# Patient Record
Sex: Female | Born: 1995 | Race: White | Hispanic: No | Marital: Single | Smoking: Current every day smoker
Health system: Southern US, Community
[De-identification: ages and names within clinical notes are randomized; demographics above are authoritative.]

## PROBLEM LIST (undated history)

## (undated) DIAGNOSIS — N200 Calculus of kidney: Secondary | ICD-10-CM

## (undated) DIAGNOSIS — N898 Other specified noninflammatory disorders of vagina: Secondary | ICD-10-CM

## (undated) DIAGNOSIS — A749 Chlamydial infection, unspecified: Secondary | ICD-10-CM

## (undated) HISTORY — DX: Chlamydial infection, unspecified: A74.9

## (undated) HISTORY — DX: Other specified noninflammatory disorders of vagina: N89.8

## (undated) HISTORY — DX: Calculus of kidney: N20.0

---
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
## 2013-07-06 ENCOUNTER — Other Ambulatory Visit: Payer: Self-pay | Admitting: Women's Health

## 2013-11-23 ENCOUNTER — Encounter: Payer: Self-pay | Admitting: Advanced Practice Midwife

## 2013-11-23 ENCOUNTER — Ambulatory Visit (INDEPENDENT_AMBULATORY_CARE_PROVIDER_SITE_OTHER): Payer: Medicaid Other | Admitting: Advanced Practice Midwife

## 2013-11-23 VITALS — BP 108/80 | Ht 60.0 in | Wt 176.0 lb

## 2013-11-23 DIAGNOSIS — Z113 Encounter for screening for infections with a predominantly sexual mode of transmission: Secondary | ICD-10-CM

## 2013-11-23 DIAGNOSIS — Z01419 Encounter for gynecological examination (general) (routine) without abnormal findings: Secondary | ICD-10-CM

## 2013-11-23 DIAGNOSIS — Z0000 Encounter for general adult medical examination without abnormal findings: Secondary | ICD-10-CM

## 2013-11-23 MED ORDER — NORETHIN-ETH ESTRAD-FE BIPHAS 1 MG-10 MCG / 10 MCG PO TABS: 1.0000 | ORAL_TABLET | Freq: Every day | ORAL | Status: DC

## 2013-11-23 NOTE — Progress Notes (Signed)
Valerie Greene 18 y.o.  Filed Vitals:   11/23/13 1116  BP: 108/80     Past Medical History: History reviewed. No pertinent past medical history.  Past Surgical History: History reviewed. No pertinent past surgical history.  Family History: No family history on file.  Social History: History  Substance Use Topics  . Smoking status: Current Every Day Smoker  . Smokeless tobacco: Not on file  . Alcohol Use: Yes    Allergies: No Known Allergies   No current outpatient prescriptions on file.  History of Present Illness:  Here for pap and physical.  Informed that pap is not indicated until age 69. Requests STD screening. Has already had HPV vaccine.  Is not on any birth control.  Options discussed.  Will try pills (had used Nexplanon in the past, bled through it).    Review of Systems   Patient denies any headaches, blurred vision, shortness of breath, chest pain, abdominal pain, problems with bowel movements, urination, or intercourse.   Physical Exam: General:  Well developed, well nourished, no acute distress Skin:  Warm and dry Neck:  Midline trachea, normal thyroid Lungs; Clear to auscultation bilaterally Breast:  Deferred  Cardiovascular: Regular rate and rhythm Abdomen:  Soft, non tender, no hepatosplenomegaly Extremities:  No swelling or varicosities noted Psych:  No mood changes.     Impression: normal PE; STD screening  Plan: GC/CHL, HIV, RPR, HSV2, Hep C

## 2013-11-24 LAB — GC/CHLAMYDIA PROBE AMP
CT PROBE, AMP APTIMA: NEGATIVE
GC Probe RNA: NEGATIVE

## 2013-11-24 LAB — HEPATITIS C ANTIBODY: HCV Ab: NEGATIVE

## 2013-11-24 LAB — HIV ANTIBODY (ROUTINE TESTING W REFLEX): HIV: NONREACTIVE

## 2013-11-24 LAB — HSV 2 ANTIBODY, IGG

## 2013-11-24 LAB — RPR

## 2013-11-26 ENCOUNTER — Emergency Department (HOSPITAL_COMMUNITY)
Admission: EM | Admit: 2013-11-26 | Discharge: 2013-11-26 | Disposition: A | Discharge status: Home / Self Care | Payer: Medicaid Other | Attending: Emergency Medicine | Admitting: Emergency Medicine

## 2013-11-26 ENCOUNTER — Encounter (HOSPITAL_COMMUNITY): Payer: Self-pay | Admitting: Emergency Medicine

## 2013-11-26 DIAGNOSIS — W268XXA Contact with other sharp object(s), not elsewhere classified, initial encounter: Secondary | ICD-10-CM | POA: Diagnosis not present

## 2013-11-26 DIAGNOSIS — Y929 Unspecified place or not applicable: Secondary | ICD-10-CM | POA: Insufficient documentation

## 2013-11-26 DIAGNOSIS — Z79899 Other long term (current) drug therapy: Secondary | ICD-10-CM | POA: Insufficient documentation

## 2013-11-26 DIAGNOSIS — S61209A Unspecified open wound of unspecified finger without damage to nail, initial encounter: Secondary | ICD-10-CM | POA: Insufficient documentation

## 2013-11-26 DIAGNOSIS — Y939 Activity, unspecified: Secondary | ICD-10-CM | POA: Insufficient documentation

## 2013-11-26 DIAGNOSIS — F17200 Nicotine dependence, unspecified, uncomplicated: Secondary | ICD-10-CM | POA: Insufficient documentation

## 2013-11-26 DIAGNOSIS — S0180XA Unspecified open wound of other part of head, initial encounter: Secondary | ICD-10-CM | POA: Diagnosis not present

## 2013-11-26 DIAGNOSIS — Z23 Encounter for immunization: Secondary | ICD-10-CM | POA: Insufficient documentation

## 2013-11-26 DIAGNOSIS — S61211A Laceration without foreign body of left index finger without damage to nail, initial encounter: Secondary | ICD-10-CM

## 2013-11-26 DIAGNOSIS — S0181XA Laceration without foreign body of other part of head, initial encounter: Secondary | ICD-10-CM

## 2013-11-26 MED ORDER — LIDOCAINE-EPINEPHRINE (PF) 1 %-1:200000 IJ SOLN
10.0000 mL | Freq: Once | INTRAMUSCULAR | Status: AC
  Administered 2013-11-26: 10 mL
  Filled 2013-11-26: qty 10

## 2013-11-26 MED ORDER — LIDOCAINE-EPINEPHRINE (PF) 1 %-1:200000 IJ SOLN
INTRAMUSCULAR | Status: AC
  Filled 2013-11-26: qty 10

## 2013-11-26 MED ORDER — BACITRACIN ZINC 500 UNIT/GM EX OINT
TOPICAL_OINTMENT | CUTANEOUS | Status: AC
  Administered 2013-11-26: 2 via TOPICAL
  Filled 2013-11-26: qty 0.9

## 2013-11-26 MED ORDER — TETANUS-DIPHTH-ACELL PERTUSSIS 5-2.5-18.5 LF-MCG/0.5 IM SUSP
0.5000 mL | Freq: Once | INTRAMUSCULAR | Status: AC
  Administered 2013-11-26: 0.5 mL via INTRAMUSCULAR
  Filled 2013-11-26: qty 0.5
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
## 2013-11-26 NOTE — Discharge Instructions (Signed)
Stitches in your face need to be removed in 5 days. Stitches in your finger need to be removed in 7 days.  Laceration Care, Adult A laceration is a cut or lesion that goes through all layers of the skin and into the tissue just beneath the skin. TREATMENT  Some lacerations may not require closure. Some lacerations may not be able to be closed due to an increased risk of infection. It is important to see your caregiver as soon as possible after an injury to minimize the risk of infection and maximize the opportunity for successful closure. If closure is appropriate, pain medicines may be given, if needed. The wound will be cleaned to help prevent infection. Your caregiver will use stitches (sutures), staples, wound glue (adhesive), or skin adhesive strips to repair the laceration. These tools bring the skin edges together to allow for faster healing and a better cosmetic outcome. However, all wounds will heal with a scar. Once the wound has healed, scarring can be minimized by covering the wound with sunscreen during the day for 1 full year. HOME CARE INSTRUCTIONS  For sutures or staples:  Keep the wound clean and dry.  If you were given a bandage (dressing), you should change it at least once a day. Also, change the dressing if it becomes wet or dirty, or as directed by your caregiver.  Wash the wound with soap and water 2 times a day. Rinse the wound off with water to remove all soap. Pat the wound dry with a clean towel.  After cleaning, apply a thin layer of the antibiotic ointment as recommended by your caregiver. This will help prevent infection and keep the dressing from sticking.  You may shower as usual after the first 24 hours. Do not soak the wound in water until the sutures are removed.  Only take over-the-counter or prescription medicines for pain, discomfort, or fever as directed by your caregiver.  Get your sutures or staples removed as directed by your caregiver. For skin  adhesive strips:  Keep the wound clean and dry.  Do not get the skin adhesive strips wet. You may bathe carefully, using caution to keep the wound dry.  If the wound gets wet, pat it dry with a clean towel.  Skin adhesive strips will fall off on their own. You may trim the strips as the wound heals. Do not remove skin adhesive strips that are still stuck to the wound. They will fall off in time. For wound adhesive:  You may briefly wet your wound in the shower or bath. Do not soak or scrub the wound. Do not swim. Avoid periods of heavy perspiration until the skin adhesive has fallen off on its own. After showering or bathing, gently pat the wound dry with a clean towel.  Do not apply liquid medicine, cream medicine, or ointment medicine to your wound while the skin adhesive is in place. This may loosen the film before your wound is healed.  If a dressing is placed over the wound, be careful not to apply tape directly over the skin adhesive. This may cause the adhesive to be pulled off before the wound is healed.  Avoid prolonged exposure to sunlight or tanning lamps while the skin adhesive is in place. Exposure to ultraviolet light in the first year will darken the scar.  The skin adhesive will usually remain in place for 5 to 10 days, then naturally fall off the skin. Do not pick at the adhesive film. You may  need a tetanus shot if:  You cannot remember when you had your last tetanus shot.  You have never had a tetanus shot. If you get a tetanus shot, your arm may swell, get red, and feel warm to the touch. This is common and not a problem. If you need a tetanus shot and you choose not to have one, there is a rare chance of getting tetanus. Sickness from tetanus can be serious. SEEK MEDICAL CARE IF:   You have redness, swelling, or increasing pain in the wound.  You see a red line that goes away from the wound.  You have yellowish-white fluid (pus) coming from the wound.  You have  a fever.  You notice a bad smell coming from the wound or dressing.  Your wound breaks open before or after sutures have been removed.  You notice something coming out of the wound such as wood or glass.  Your wound is on your hand or foot and you cannot move a finger or toe. SEEK IMMEDIATE MEDICAL CARE IF:   Your pain is not controlled with prescribed medicine.  You have severe swelling around the wound causing pain and numbness or a change in color in your arm, hand, leg, or foot.  Your wound splits open and starts bleeding.  You have worsening numbness, weakness, or loss of function of any joint around or beyond the wound.  You develop painful lumps near the wound or on the skin anywhere on your body. MAKE SURE YOU:   Understand these instructions.  Will watch your condition.  Will get help right away if you are not doing well or get worse. Document Released: 03/17/2005 Document Revised: 06/09/2011 Document Reviewed: 09/10/2010 [facility] Patient Information 2015 [place], [place]. This information is not intended to replace advice given to you by your health care provider. Make sure you discuss any questions you have with your health care provider.  Tdap Vaccine (Tetanus, Diphtheria, Pertussis): What You Need to Know 1. Why get vaccinated? Tetanus, diphtheria and pertussis can be very serious diseases, even for adolescents and adults. Tdap vaccine can protect [place] from these diseases. TETANUS (Lockjaw) causes painful muscle tightening and stiffness, usually all over the body.  It can lead to tightening of muscles in the head and neck so you can't open your mouth, swallow, or sometimes even breathe. Tetanus kills about 1 out of 5 people who are infected. DIPHTHERIA can cause a thick coating to form in the back of the throat.  It can lead to breathing problems, paralysis, heart failure, and death. PERTUSSIS (Whooping Cough) causes severe coughing spells, which can cause difficulty  breathing, vomiting and disturbed sleep.  It can also lead to weight loss, incontinence, and rib fractures. Up to 2 in 100 adolescents and 5 in 100 adults with pertussis are hospitalized or have complications, which could include pneumonia or death. These diseases are caused by bacteria. Diphtheria and pertussis are spread from person to person through coughing or sneezing. Tetanus enters the body through cuts, scratches, or wounds. Before vaccines, the [place] saw as many as 200,000 cases a year of diphtheria and pertussis, and hundreds of cases of tetanus. Since vaccination began, tetanus and diphtheria have dropped by about 99% and pertussis by about 80%. 2. Tdap vaccine Tdap vaccine can protect adolescents and adults from tetanus, diphtheria, and pertussis. One dose of Tdap is routinely given at age 61 or 43. People who did not get Tdap at that age should get it as soon  as possible. Tdap is especially important for health care professionals and anyone having close contact with a baby younger than 12 months. Pregnant women should get a dose of Tdap during every pregnancy, to protect the newborn from pertussis. Infants are most at risk for severe, life-threatening complications from pertussis. A similar vaccine, called Td, protects from tetanus and diphtheria, but not pertussis. A Td booster should be given every 10 years. Tdap may be given as one of these boosters if you have not already gotten a dose. Tdap may also be given after a severe cut or burn to prevent tetanus infection. Your doctor can give you more information. Tdap may safely be given at the same time as other vaccines. 3. Some people should not get this vaccine  If you ever had a life-threatening allergic reaction after a dose of any tetanus, diphtheria, or pertussis containing vaccine, OR if you have a severe allergy to any part of this vaccine, you should not get Tdap. Tell your doctor if you have any severe allergies.  If  you had a coma, or long or multiple seizures within 7 days after a childhood dose of DTP or DTaP, you should not get Tdap, unless a cause other than the vaccine was found. You can still get Td.  Talk to your doctor if you:  have epilepsy or another nervous system problem,  had severe pain or swelling after any vaccine containing diphtheria, tetanus or pertussis,  ever had Guillain-Barr Syndrome (GBS),  aren't feeling well on the day the shot is scheduled. 4. Risks of a vaccine reaction With any medicine, including vaccines, there is a chance of side effects. These are usually mild and go away on their own, but serious reactions are also possible. Brief fainting spells can follow a vaccination, leading to injuries from falling. Sitting or lying down for about 15 minutes can help prevent these. Tell your doctor if you feel dizzy or light-headed, or have vision changes or ringing in the ears. Mild problems following Tdap (Did not interfere with activities)  Pain where the shot was given (about 3 in 4 adolescents or 2 in 3 adults)  Redness or swelling where the shot was given (about 1 person in 5)  Mild fever of at least 100.70F (up to about 1 in 25 adolescents or 1 in 100 adults)  Headache (about 3 or 4 people in 10)  Tiredness (about 1 person in 3 or 4)  Nausea, vomiting, diarrhea, stomach ache (up to 1 in 4 adolescents or 1 in 10 adults)  Chills, body aches, sore joints, rash, swollen glands (uncommon) Moderate problems following Tdap (Interfered with activities, but did not require medical attention)  Pain where the shot was given (about 1 in 5 adolescents or 1 in 100 adults)  Redness or swelling where the shot was given (up to about 1 in 16 adolescents or 1 in 25 adults)  Fever over 102F (about 1 in 100 adolescents or 1 in 250 adults)  Headache (about 3 in 20 adolescents or 1 in 10 adults)  Nausea, vomiting, diarrhea, stomach ache (up to 1 or 3 people in 100)  Swelling  of the entire arm where the shot was given (up to about 3 in 100). Severe problems following Tdap (Unable to perform usual activities; required medical attention)  Swelling, severe pain, bleeding and redness in the arm where the shot was given (rare). A severe allergic reaction could occur after any vaccine (estimated less than 1 in a million doses).  5. What if there is a serious reaction? What should I look for?  Look for anything that concerns you, such as signs of a severe allergic reaction, very high fever, or behavior changes. Signs of a severe allergic reaction can include hives, swelling of the face and throat, difficulty breathing, a fast heartbeat, dizziness, and weakness. These would start a few minutes to a few hours after the vaccination. What should I do?  If you think it is a severe allergic reaction or other emergency that can't wait, call 9-1-1 or get the person to the nearest hospital. Otherwise, call your doctor.  Afterward, the reaction should be reported to the "Vaccine Adverse Event Reporting System" (VAERS). Your doctor might file this report, or you can do it yourself through the VAERS web site at www.vaers.LAgents.no, or by calling 1-(872)170-1846. VAERS is only for reporting reactions. They do not give medical advice.  6. The National Vaccine Injury Compensation Program The [employer] Vaccine Injury Compensation Program (VICP) is a federal program that was created to compensate people who may have been injured by certain vaccines. Persons who believe they may have been injured by a vaccine can learn about the program and about filing a claim by calling 1-919-237-5053 or visiting the VICP website at SpiritualWord.at. 7. How can I learn more?  Ask your doctor.  Call your local or state health department.  Contact the Centers for Disease Control and Prevention (CDC):  Call 782-475-6645 or visit CDC's website at PicCapture.uy. CDC Tdap Vaccine VIS  (08/07/11) Document Released: 09/16/2011 Document Revised: 08/01/2013 Document Reviewed: 06/29/2013 [employer] Patient Information 2015 [place], [place]. This information is not intended to replace advice given to you by your health care provider. Make sure you discuss any questions you have with your health care provider.
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
## 2013-11-26 NOTE — ED Notes (Signed)
Much calmer, cooperative. sutured

## 2013-11-28 LAB — TRICHOMONAS VAGINALIS, PROBE AMP: TRICHOMONAS VAGINALIS PROBE APTIMA: NEGATIVE

## 2013-11-30 ENCOUNTER — Telehealth: Payer: Self-pay | Admitting: Advanced Practice Midwife

## 2013-11-30 NOTE — Telephone Encounter (Signed)
Pt informed of negative lab results.  

## 2013-12-01 ENCOUNTER — Emergency Department (HOSPITAL_COMMUNITY)
Admission: EM | Admit: 2013-12-01 | Discharge: 2013-12-01 | Discharge status: Against Medical Advice | Payer: Medicaid Other | Attending: Emergency Medicine | Admitting: Emergency Medicine

## 2013-12-01 ENCOUNTER — Encounter (HOSPITAL_COMMUNITY): Payer: Self-pay | Admitting: Emergency Medicine

## 2013-12-01 DIAGNOSIS — Z4802 Encounter for removal of sutures: Secondary | ICD-10-CM | POA: Diagnosis present

## 2013-12-01 NOTE — ED Notes (Signed)
Pt no longer in room on multiple occasions, pt left without informing staff of leaving

## 2013-12-01 NOTE — ED Notes (Signed)
PT had sutures x5 days to forehead and left hand index finger. PT was told to come to ED for suture removal.

## 2013-12-21 ENCOUNTER — Encounter: Payer: Self-pay | Admitting: Adult Health

## 2013-12-21 ENCOUNTER — Ambulatory Visit (INDEPENDENT_AMBULATORY_CARE_PROVIDER_SITE_OTHER): Payer: Medicaid Other | Admitting: Adult Health

## 2013-12-21 VITALS — BP 100/60 | Ht 60.0 in | Wt 177.0 lb

## 2013-12-21 DIAGNOSIS — N898 Other specified noninflammatory disorders of vagina: Secondary | ICD-10-CM

## 2013-12-21 HISTORY — DX: Other specified noninflammatory disorders of vagina: N89.8

## 2013-12-21 LAB — POCT WET PREP (WET MOUNT)
TRICHOMONAS WET PREP HPF POC: NEGATIVE
WBC, Wet Prep HPF POC: NEGATIVE

## 2013-12-21 NOTE — Progress Notes (Signed)
Subjective:     Patient ID: Valerie Greene, female   DOB: November 20, 1995, 17 y.o.   MRN: [id number]  HPI Valerie Greene is a 18 year old white female in complaining of discharge x 4-5 months, had negative STD testing in August.   Review of Systems See HPI Reviewed past medical,surgical, social and family history. Reviewed medications and allergies.     Objective:   Physical Exam BP 100/60  Ht 5' (1.524 m)  Wt 177 lb (80.287 kg)  BMI 34.57 kg/m2  LMP 12/15/2013   Skin warm and dry.Pelvic: external genitalia is normal in appearance, vagina:creamy discharge without odor, cervix:smooth and bulbous, uterus: normal size, shape and contour, non tender, no masses felt, adnexa: no masses or tenderness noted. Wet prep: negative, discussed normal discharge,pills should decrease discharge.  Assessment:     Vaginal discharge    Plan:     Continue OCs  Follow up prn

## 2013-12-21 NOTE — Patient Instructions (Signed)
Continue OCs  Discharge was normal

## 2014-01-30 ENCOUNTER — Encounter: Payer: Self-pay | Admitting: Adult Health

## 2014-02-22 ENCOUNTER — Ambulatory Visit: Payer: Medicaid Other | Admitting: Advanced Practice Midwife

## 2014-03-01 ENCOUNTER — Ambulatory Visit: Payer: Medicaid Other | Admitting: Advanced Practice Midwife

## 2014-03-07 ENCOUNTER — Ambulatory Visit: Payer: Medicaid Other | Admitting: Advanced Practice Midwife

## 2014-03-15 ENCOUNTER — Ambulatory Visit: Payer: Medicaid Other | Admitting: Advanced Practice Midwife

## 2014-04-05 ENCOUNTER — Encounter: Payer: Self-pay | Admitting: Advanced Practice Midwife

## 2014-04-05 ENCOUNTER — Ambulatory Visit: Payer: Medicaid Other | Admitting: Advanced Practice Midwife

## 2014-09-28 ENCOUNTER — Encounter: Payer: Self-pay | Admitting: Obstetrics & Gynecology

## 2014-09-28 ENCOUNTER — Ambulatory Visit (INDEPENDENT_AMBULATORY_CARE_PROVIDER_SITE_OTHER): Payer: Medicaid Other | Admitting: Obstetrics & Gynecology

## 2014-09-28 VITALS — BP 100/70 | HR 80 | Wt 181.4 lb

## 2014-09-28 DIAGNOSIS — N938 Other specified abnormal uterine and vaginal bleeding: Secondary | ICD-10-CM

## 2014-09-28 DIAGNOSIS — N939 Abnormal uterine and vaginal bleeding, unspecified: Secondary | ICD-10-CM

## 2014-09-28 LAB — POCT HEMOGLOBIN: Hemoglobin: 13.4 g/dL (ref 12.2–16.2)

## 2014-09-28 MED ORDER — NORETHIN-ETH ESTRAD-FE BIPHAS 1 MG-10 MCG / 10 MCG PO TABS: 1.0000 | ORAL_TABLET | Freq: Every day | ORAL | Status: DC

## 2014-09-28 NOTE — Progress Notes (Signed)
Patient ID: Valerie Greene, female   DOB: 05/14/1995, 19 y.o.   MRN: [id number] Chief Complaint  Patient presents with  . gyn visit    passing blood clots last night    Pt on lo lo estrin Generally no issues with ocp Wants to continue Bleeding has stopped now  Not time for her yearly and Pap at this point Refilled her OCP     Face to face time:  10 minutes  Greater than 50% of the visit time was spent in counseling and coordination of care with the patient.  The summary and outline of the counseling and care coordination is summarized in the note above.   All questions were answered.

## 2014-10-12 ENCOUNTER — Other Ambulatory Visit: Payer: Medicaid Other | Admitting: Obstetrics & Gynecology

## 2014-11-30 ENCOUNTER — Other Ambulatory Visit: Payer: Medicaid Other | Admitting: Obstetrics & Gynecology

## 2014-11-30 ENCOUNTER — Encounter: Payer: Self-pay | Admitting: Obstetrics & Gynecology

## 2015-02-06 ENCOUNTER — Encounter: Payer: Self-pay | Admitting: Obstetrics & Gynecology

## 2015-02-06 ENCOUNTER — Ambulatory Visit: Payer: Medicaid Other | Admitting: Obstetrics & Gynecology

## 2015-03-05 ENCOUNTER — Other Ambulatory Visit: Payer: Medicaid Other | Admitting: Obstetrics & Gynecology

## 2015-03-05 ENCOUNTER — Encounter: Payer: Self-pay | Admitting: Obstetrics & Gynecology

## 2016-03-31 DIAGNOSIS — B029 Zoster without complications: Secondary | ICD-10-CM

## 2016-03-31 HISTORY — DX: Zoster without complications: B02.9

## 2016-06-04 ENCOUNTER — Ambulatory Visit (INDEPENDENT_AMBULATORY_CARE_PROVIDER_SITE_OTHER): Payer: Medicaid Other | Admitting: Adult Health

## 2016-06-04 ENCOUNTER — Other Ambulatory Visit (HOSPITAL_COMMUNITY)
Admission: RE | Admit: 2016-06-04 | Discharge: 2016-06-04 | Disposition: A | Discharge status: Home / Self Care | Payer: Medicaid Other | Source: Ambulatory Visit | Attending: Adult Health | Admitting: Adult Health

## 2016-06-04 ENCOUNTER — Encounter: Payer: Self-pay | Admitting: Adult Health

## 2016-06-04 VITALS — BP 128/88 | HR 102 | Ht 61.0 in | Wt 211.0 lb

## 2016-06-04 DIAGNOSIS — Z01411 Encounter for gynecological examination (general) (routine) with abnormal findings: Secondary | ICD-10-CM | POA: Diagnosis not present

## 2016-06-04 DIAGNOSIS — R635 Abnormal weight gain: Secondary | ICD-10-CM

## 2016-06-04 DIAGNOSIS — Z6839 Body mass index (BMI) 39.0-39.9, adult: Secondary | ICD-10-CM

## 2016-06-04 DIAGNOSIS — Z0000 Encounter for general adult medical examination without abnormal findings: Secondary | ICD-10-CM | POA: Diagnosis not present

## 2016-06-04 DIAGNOSIS — Z113 Encounter for screening for infections with a predominantly sexual mode of transmission: Secondary | ICD-10-CM | POA: Diagnosis present

## 2016-06-04 DIAGNOSIS — Z01419 Encounter for gynecological examination (general) (routine) without abnormal findings: Secondary | ICD-10-CM | POA: Diagnosis not present

## 2016-06-04 DIAGNOSIS — Z713 Dietary counseling and surveillance: Secondary | ICD-10-CM

## 2016-06-04 DIAGNOSIS — Z124 Encounter for screening for malignant neoplasm of cervix: Secondary | ICD-10-CM

## 2016-06-04 DIAGNOSIS — Z131 Encounter for screening for diabetes mellitus: Secondary | ICD-10-CM

## 2016-06-04 NOTE — Progress Notes (Signed)
Patient ID: Valerie Greene, female   DOB: 02/09/1996, 21 y.o.   MRN: [id number] History of Present Illness: Valerie Greene is a 21 year old white female in for well woman gyn exam and pap, and wants labs. PCP is [facility].  Current Medications, Allergies, Past Medical History, Past Surgical History, Family History and Social History were reviewed in [facility] electronic medical record.     Review of Systems:  Patient denies any headaches, hearing loss, fatigue, blurred vision, shortness of breath, chest pain, abdominal pain, problems with bowel movements, urination, or intercourse. No joint pain or mood swings.+ weight gain, vaginal discharge at times, periods regular, is not on birth control and declines at this time.   Physical Exam:BP 128/88 (BP Location: Left Arm, Patient Position: Sitting, Cuff Size: Normal)   Pulse (!) 102   Ht 5\' 1"  (1.549 m)   Wt 211 lb (95.7 kg)   LMP 05/25/2016 (Approximate)   BMI 39.87 kg/m  General:  Well developed, well nourished, no acute distress Skin:  Warm and dry Neck:  Midline trachea, normal thyroid, good ROM, no lymphadenopathy Lungs; Clear to auscultation bilaterally Breast:  No dominant palpable mass, retraction, or nipple discharge Cardiovascular: Regular rate and rhythm Abdomen:  Soft, non tender, no hepatosplenomegaly Pelvic:  External genitalia is normal in appearance, no lesions.  The vagina is normal in appearance. Urethra has no lesions or masses. The cervix is bulbous,pap with GC/CHL performed.  Uterus is felt to be normal size, shape, and contour.  No adnexal masses or tenderness noted.Bladder is non tender, no masses felt. Extremities/musculoskeletal:  No swelling or varicosities noted, no clubbing or cyanosis Psych:  No mood changes, alert and cooperative,seems happy PHQ 2 score 0.She eats out a lot, discussed cutting portions and carbs and trying to increase activity.  Impression: 1. Encounter for routine gynecological examination  with Papanicolaou smear of cervix   2. Routine cervical smear   3. Screening examination for STD (sexually transmitted disease)   4. Screening for diabetes mellitus   5. Weight gain   6. Body mass index 39.0-39.9, adult   7.      Weight loss counseling     Plan: Check CBC,CMP,TSH and lipids,A1c and vitamin D and HIV and RPR Physical in 1 year Pap in 3 if normal Cut carbs and portion size,increase activity, will talk when labs back, about possible adipex

## 2016-06-05 ENCOUNTER — Telehealth: Payer: Self-pay | Admitting: Adult Health

## 2016-06-05 LAB — CBC
HEMATOCRIT: 42.6 % (ref 34.0–46.6)
HEMOGLOBIN: 14.4 g/dL (ref 11.1–15.9)
MCH: 31 pg (ref 26.6–33.0)
MCHC: 33.8 g/dL (ref 31.5–35.7)
MCV: 92 fL (ref 79–97)
Platelets: 312 10*3/uL (ref 150–379)
RBC: 4.65 x10E6/uL (ref 3.77–5.28)
RDW: 13.1 % (ref 12.3–15.4)
WBC: 8.4 10*3/uL (ref 3.4–10.8)

## 2016-06-05 LAB — HIV ANTIBODY (ROUTINE TESTING W REFLEX): HIV SCREEN 4TH GENERATION: NONREACTIVE

## 2016-06-05 LAB — HEMOGLOBIN A1C
Est. average glucose Bld gHb Est-mCnc: 100 mg/dL
Hgb A1c MFr Bld: 5.1 % (ref 4.8–5.6)

## 2016-06-05 LAB — COMPREHENSIVE METABOLIC PANEL
ALBUMIN: 4.8 g/dL (ref 3.5–5.5)
ALK PHOS: 58 IU/L (ref 39–117)
ALT: 38 IU/L — ABNORMAL HIGH (ref 0–32)
AST: 39 IU/L (ref 0–40)
Albumin/Globulin Ratio: 1.8 (ref 1.2–2.2)
BUN / CREAT RATIO: 8 — AB (ref 9–23)
BUN: 6 mg/dL (ref 6–20)
Bilirubin Total: 0.4 mg/dL (ref 0.0–1.2)
CALCIUM: 9.6 mg/dL (ref 8.7–10.2)
CO2: 25 mmol/L (ref 18–29)
CREATININE: 0.73 mg/dL (ref 0.57–1.00)
Chloride: 101 mmol/L (ref 96–106)
GFR, EST AFRICAN AMERICAN: 135 mL/min/{1.73_m2} (ref >59)
GFR, EST NON AFRICAN AMERICAN: 117 mL/min/{1.73_m2} (ref >59)
Globulin, Total: 2.7 g/dL (ref 1.5–4.5)
Glucose: 87 mg/dL (ref 65–99)
Potassium: 4.7 mmol/L (ref 3.5–5.2)
Sodium: 140 mmol/L (ref 134–144)
TOTAL PROTEIN: 7.5 g/dL (ref 6.0–8.5)

## 2016-06-05 LAB — TSH: TSH: 1.77 u[IU]/mL (ref 0.450–4.500)

## 2016-06-05 LAB — LIPID PANEL
CHOL/HDL RATIO: 3.3 ratio (ref 0.0–4.4)
Cholesterol, Total: 147 mg/dL (ref 100–199)
HDL: 44 mg/dL (ref >39)
LDL CALC: 81 mg/dL (ref 0–99)
Triglycerides: 109 mg/dL (ref 0–149)
VLDL CHOLESTEROL CAL: 22 mg/dL (ref 5–40)

## 2016-06-05 LAB — VITAMIN D 25 HYDROXY (VIT D DEFICIENCY, FRACTURES): Vit D, 25-Hydroxy: 34.2 ng/mL (ref 30.0–100.0)

## 2016-06-05 LAB — RPR: RPR: NONREACTIVE

## 2016-06-05 MED ORDER — PHENTERMINE HCL 15 MG PO CAPS: 15.0000 mg | ORAL_CAPSULE | ORAL | 0 refills | Status: DC

## 2016-06-05 NOTE — Telephone Encounter (Signed)
Rx sent for phentermine 15 mg 1 po daily and has appt in 4 weeks for weight and BP check

## 2016-06-05 NOTE — Telephone Encounter (Signed)
Pt aware of labs, and need to watch, alcohol and tylenol and ibuprofen use.She wants to try adipex, will  Send to [facility].

## 2016-06-09 LAB — CYTOLOGY - PAP
CHLAMYDIA, DNA PROBE: POSITIVE — AB
Diagnosis: NEGATIVE
Neisseria Gonorrhea: NEGATIVE

## 2016-06-10 ENCOUNTER — Telehealth: Payer: Self-pay | Admitting: *Deleted

## 2016-06-10 ENCOUNTER — Telehealth: Payer: Self-pay | Admitting: Adult Health

## 2016-06-10 NOTE — Telephone Encounter (Signed)
Left message I called, call me in am

## 2016-06-10 NOTE — Telephone Encounter (Signed)
Left message to call about pap 

## 2016-06-11 ENCOUNTER — Encounter: Payer: Self-pay | Admitting: Adult Health

## 2016-06-11 ENCOUNTER — Telehealth: Payer: Self-pay | Admitting: Adult Health

## 2016-06-11 DIAGNOSIS — A749 Chlamydial infection, unspecified: Secondary | ICD-10-CM | POA: Insufficient documentation

## 2016-06-11 HISTORY — DX: Chlamydial infection, unspecified: A74.9

## 2016-06-11 MED ORDER — AZITHROMYCIN 500 MG PO TABS: ORAL_TABLET | ORAL | 0 refills | Status: DC

## 2016-06-11 MED ORDER — METRONIDAZOLE 500 MG PO TABS: 500.0000 mg | ORAL_TABLET | Freq: Two times a day (BID) | ORAL | 0 refills | Status: DC

## 2016-06-11 NOTE — Telephone Encounter (Signed)
Pt aware +chlamydia on pap, will rx azithromycin 500 mg 2 po now and check proof of treatment on 4/5 appt, no sex, NCCDRC sent. She says she has shingles, now too.will rx flagyl for BV

## 2016-06-11 NOTE — Telephone Encounter (Signed)
Left message to call me.

## 2016-06-26 ENCOUNTER — Other Ambulatory Visit: Payer: Self-pay | Admitting: Advanced Practice Midwife

## 2016-06-26 ENCOUNTER — Telehealth: Payer: Self-pay | Admitting: *Deleted

## 2016-06-26 MED ORDER — FLUCONAZOLE 150 MG PO TABS: ORAL_TABLET | ORAL | 2 refills | Status: DC

## 2016-06-26 NOTE — Telephone Encounter (Signed)
Patient states she has just recently finished dose of antibiotics and now has a yeast infection. Would like something for a yeast infection. Please advise.

## 2016-06-26 NOTE — Telephone Encounter (Signed)
Diflucan sent

## 2016-06-26 NOTE — Progress Notes (Signed)
Diflucan for yeast

## 2016-07-03 ENCOUNTER — Ambulatory Visit (INDEPENDENT_AMBULATORY_CARE_PROVIDER_SITE_OTHER): Payer: Medicaid Other | Admitting: Adult Health

## 2016-07-03 ENCOUNTER — Encounter: Payer: Self-pay | Admitting: Adult Health

## 2016-07-03 VITALS — BP 100/60 | HR 76 | Ht 62.0 in | Wt 212.0 lb

## 2016-07-03 DIAGNOSIS — Z8619 Personal history of other infectious and parasitic diseases: Secondary | ICD-10-CM | POA: Diagnosis not present

## 2016-07-03 NOTE — Progress Notes (Addendum)
Subjective:     Patient ID: Valerie Greene, female   DOB: 1995-10-28, 21 y.o.   MRN: [id number]  HPI Valerie Greene is a 21 year old white female in for proof of treatment for recent +chlamydia on pap 06/04/16, took meds, and has not had sex.   Review of Systems Patient denies any headaches, hearing loss, fatigue, blurred vision, shortness of breath, chest pain, abdominal pain, problems with bowel movements, urination, or intercourse(no sex since treatment). No joint pain or mood swings. Reviewed past medical,surgical, social and family history. Reviewed medications and allergies.     Objective:   Physical Exam BP 100/60 (BP Location: Left Arm, Patient Position: Sitting, Cuff Size: Large)   Pulse 76   Ht  (1.575 m)   Wt 212 lb (96.2 kg)   LMP 06/19/2016 (Approximate)   BMI 38.78 kg/m    Skin warm and dry. Lungs: clear to ausculation bilaterally. Cardiovascular: regular rate and rhythm.Pelvic: external genitalia is normal in appearance no lesions, vagina: normal in appearance, no discharge,urethra has no lesions or masses noted, cervix:smooth. GC/CHL obtained.   Assessment:     1. History of chlamydia       Plan:     GC/CHL sent Use condoms Follow up prn

## 2016-07-06 LAB — GC/CHLAMYDIA PROBE AMP
CHLAMYDIA, DNA PROBE: NEGATIVE
Neisseria gonorrhoeae by PCR: NEGATIVE

## 2017-02-04 ENCOUNTER — Ambulatory Visit: Payer: Medicaid Other | Admitting: Adult Health

## 2017-02-04 ENCOUNTER — Encounter: Payer: Self-pay | Admitting: Adult Health

## 2017-02-04 VITALS — BP 134/62 | HR 98 | Ht 62.0 in | Wt 207.0 lb

## 2017-02-04 DIAGNOSIS — Z113 Encounter for screening for infections with a predominantly sexual mode of transmission: Secondary | ICD-10-CM | POA: Diagnosis not present

## 2017-02-04 NOTE — Progress Notes (Signed)
Subjective:     Patient ID: Valerie Greene, female   DOB: 08/27/1995, 21 y.o.   MRN: [id number]  HPI Valerie Greene is a 21 year old white female in for STD testing.  Review of Systems Patient denies any headaches, hearing loss, fatigue, blurred vision, shortness of breath, chest pain, abdominal pain, problems with bowel movements, urination, or intercourse. No joint pain or mood swings. Reviewed past medical,surgical, social and family history. Reviewed medications and allergies.     Objective:   Physical Exam BP 134/62 (BP Location: Left Arm, Patient Position: Sitting, Cuff Size: Normal)   Pulse 98   Ht 5\' 2"  (1.575 m)   Wt 207 lb (93.9 kg)   LMP 01/17/2017 (Exact Date)   BMI 37.86 kg/m  Skin warm and dry.Pelvic: external genitalia is normal in appearance no lesions, vagina: scant discharge without odor,urethra has no lesions or masses noted, cervix:smooth.Has healing boil left inner thigh.  GC/CHL obtained. She had negative HIV and RPR in March 2018.     Assessment:     STD screening    Plan:    GC/CHl sent Use condoms  F/U prn

## 2017-02-04 NOTE — Patient Instructions (Signed)
F/U prn Use condoms

## 2017-02-06 LAB — GC/CHLAMYDIA PROBE AMP
CHLAMYDIA, DNA PROBE: NEGATIVE
NEISSERIA GONORRHOEAE BY PCR: NEGATIVE

## 2017-03-18 ENCOUNTER — Ambulatory Visit: Payer: Medicaid Other | Admitting: Advanced Practice Midwife

## 2017-04-13 ENCOUNTER — Other Ambulatory Visit: Payer: Self-pay | Admitting: Obstetrics and Gynecology

## 2017-04-13 DIAGNOSIS — O3680X0 Pregnancy with inconclusive fetal viability, not applicable or unspecified: Secondary | ICD-10-CM

## 2017-04-14 ENCOUNTER — Other Ambulatory Visit: Payer: Medicaid Other

## 2017-06-04 ENCOUNTER — Ambulatory Visit: Payer: Medicaid Other | Admitting: Adult Health

## 2017-06-12 ENCOUNTER — Encounter: Payer: Self-pay | Admitting: Adult Health

## 2017-06-12 ENCOUNTER — Ambulatory Visit: Payer: Medicaid Other | Admitting: Adult Health

## 2017-06-12 ENCOUNTER — Other Ambulatory Visit: Payer: Self-pay

## 2017-06-12 VITALS — BP 108/70 | HR 81 | Ht 60.0 in | Wt 211.0 lb

## 2017-06-12 DIAGNOSIS — Z8619 Personal history of other infectious and parasitic diseases: Secondary | ICD-10-CM

## 2017-06-12 DIAGNOSIS — Z113 Encounter for screening for infections with a predominantly sexual mode of transmission: Secondary | ICD-10-CM | POA: Diagnosis not present

## 2017-06-12 NOTE — Progress Notes (Signed)
Subjective:     Patient ID: Pincus Largemanda P Gaynor, female   DOB: 06/15/1995, 22 y.o.   MRN: [id number]  HPI Marchelle Folksmanda is a 22 year old white female in for STD testing, has new partner of 2 months, no discharge.Had abortion in January, has Rx for OCs but not taking.   Review of Systems Patient denies any headaches, hearing loss, fatigue, blurred vision, shortness of breath, chest pain, abdominal pain, problems with bowel movements, urination, or intercourse. No joint pain or mood swings.No discharge.  Reviewed past medical,surgical, social and family history. Reviewed medications and allergies.     Objective:   Physical Exam BP 108/70 (BP Location: Right Arm, Patient Position: Sitting, Cuff Size: Large)   Pulse 81   Ht 5' (1.524 m)   Wt 211 lb (95.7 kg)   LMP 06/01/2017   BMI 41.21 kg/m  Skin warm and dry.Pelvic: external genitalia is normal in appearance no lesions, vagina: scant discharge without odor,urethra has no lesions or masses noted, cervix:smooth, uterus: normal size, shape and contour, non tender, no masses felt, adnexa: no masses or tenderness noted. Bladder is non tender and no masses felt.Nuswab obtained.    Assessment:     1. Screening examination for STD (sexually transmitted disease)   2. History of chlamydia       Plan:     Nuswab sent Use condoms F/U prn

## 2017-06-19 ENCOUNTER — Telehealth: Payer: Self-pay | Admitting: Adult Health

## 2017-06-19 ENCOUNTER — Other Ambulatory Visit: Payer: Self-pay | Admitting: Adult Health

## 2017-06-19 LAB — NUSWAB VAGINITIS PLUS (VG+)
ATOPOBIUM VAGINAE: HIGH {score} — AB
BVAB 2: HIGH Score — AB
CANDIDA ALBICANS, NAA: NEGATIVE
CANDIDA GLABRATA, NAA: NEGATIVE
Chlamydia trachomatis, NAA: NEGATIVE
Megasphaera 1: HIGH Score — AB
NEISSERIA GONORRHOEAE, NAA: NEGATIVE
Trich vag by NAA: NEGATIVE

## 2017-06-19 MED ORDER — METRONIDAZOLE 500 MG PO TABS
500.0000 mg | ORAL_TABLET | Freq: Two times a day (BID) | ORAL | 0 refills | Status: DC
Start: 2017-06-19 — End: 2017-08-06

## 2017-06-19 NOTE — Progress Notes (Signed)
Rx flagyl for BV on Nuswab

## 2017-06-19 NOTE — Telephone Encounter (Signed)
Patient called stating that she would like to know the results of her blood work. Pt states that she is at work and would like us to send her a Mychart visit. Please contact pt

## 2017-07-01 ENCOUNTER — Telehealth: Payer: Self-pay | Admitting: Adult Health

## 2017-07-01 MED ORDER — FLUCONAZOLE 150 MG PO TABS: ORAL_TABLET | ORAL | 1 refills | Status: DC

## 2017-07-01 NOTE — Telephone Encounter (Signed)
On flagyl and now has yeast infection she thinks, finish flagyl and will rx diflucan.

## 2017-08-04 ENCOUNTER — Telehealth: Payer: Self-pay | Admitting: Obstetrics & Gynecology

## 2017-08-04 NOTE — Telephone Encounter (Signed)
Voice mail not set up x 2. JSY 

## 2017-08-04 NOTE — Telephone Encounter (Signed)
Voice mail not set up @ 9:56 am. [employer]

## 2017-08-04 NOTE — Telephone Encounter (Signed)
Patient called stating that she is having some issues and would like to speak with a nurse. Please contact pt

## 2017-08-05 NOTE — Telephone Encounter (Signed)
Spoke with pt. Pt is having urinary symptoms and feels like she needs to be seen sooner than June. I advised to call PCP for appt. Pt voiced understanding. JSY

## 2017-08-06 ENCOUNTER — Other Ambulatory Visit: Payer: Self-pay

## 2017-08-06 ENCOUNTER — Emergency Department (HOSPITAL_COMMUNITY)
Admission: EM | Admit: 2017-08-06 | Discharge: 2017-08-06 | Disposition: A | Discharge status: Home / Self Care | Payer: Medicaid Other | Attending: Emergency Medicine | Admitting: Emergency Medicine

## 2017-08-06 ENCOUNTER — Encounter (HOSPITAL_COMMUNITY): Payer: Self-pay

## 2017-08-06 DIAGNOSIS — N3001 Acute cystitis with hematuria: Secondary | ICD-10-CM | POA: Diagnosis not present

## 2017-08-06 DIAGNOSIS — Z79899 Other long term (current) drug therapy: Secondary | ICD-10-CM | POA: Diagnosis not present

## 2017-08-06 DIAGNOSIS — Z3202 Encounter for pregnancy test, result negative: Secondary | ICD-10-CM | POA: Diagnosis not present

## 2017-08-06 DIAGNOSIS — R1031 Right lower quadrant pain: Secondary | ICD-10-CM | POA: Diagnosis present

## 2017-08-06 DIAGNOSIS — F17210 Nicotine dependence, cigarettes, uncomplicated: Secondary | ICD-10-CM | POA: Insufficient documentation

## 2017-08-06 LAB — URINALYSIS, ROUTINE W REFLEX MICROSCOPIC
Glucose, UA: NEGATIVE mg/dL
NITRITE: POSITIVE — AB
PH: 6.5 (ref 5.0–8.0)
PROTEIN: 100 mg/dL — AB
Specific Gravity, Urine: >1.03 — ABNORMAL HIGH (ref 1.005–1.030)

## 2017-08-06 LAB — URINALYSIS, MICROSCOPIC (REFLEX)
RBC / HPF: >50 RBC/hpf (ref 0–5)
Squamous Epithelial / LPF: NONE SEEN (ref 0–5)

## 2017-08-06 LAB — POC URINE PREG, ED: PREG TEST UR: NEGATIVE

## 2017-08-06 MED ORDER — CEPHALEXIN 500 MG PO CAPS: 500.0000 mg | ORAL_CAPSULE | Freq: Two times a day (BID) | ORAL | 0 refills | Status: DC

## 2017-08-06 MED ORDER — IBUPROFEN 400 MG PO TABS
400.0000 mg | ORAL_TABLET | Freq: Once | ORAL | Status: AC
  Administered 2017-08-06: 400 mg via ORAL
  Filled 2017-08-06: qty 1

## 2017-08-06 MED ORDER — CEPHALEXIN 500 MG PO CAPS
500.0000 mg | ORAL_CAPSULE | Freq: Once | ORAL | Status: AC
  Administered 2017-08-06: 500 mg via ORAL
  Filled 2017-08-06: qty 1

## 2017-08-06 NOTE — ED Triage Notes (Signed)
Pt states she awoke with sharp rlq abd pain, denies n/v/d

## 2017-08-06 NOTE — ED Provider Notes (Signed)
[facility] EMERGENCY DEPARTMENT Provider Note   CSN: [id number] Arrival date & time: 08/06/17  0414     History   Chief Complaint Chief Complaint  Patient presents with  . Abdominal Pain    HPI Valerie Greene is a 22 y.o. female.  The history is provided by the patient.  Abdominal Pain   This is a new problem. Episode onset: Prior to arrival. The problem occurs constantly. The problem has not changed since onset.The pain is located in the RLQ. The quality of the pain is sharp. The pain is moderate. Associated symptoms include hematuria. Pertinent negatives include fever, diarrhea, nausea, vomiting and dysuria. Nothing aggravates the symptoms. Nothing relieves the symptoms.  Patient reports abrupt onset of right lower quadrant pain tonight.  She reports that is sharp in nature.  There is no back pain. She reports she had an episode of this last week that resolved spontaneously.   She does report some hematuria. Past Medical History:  Diagnosis Date  . Chlamydia 06/11/2016  . Vaginal discharge 12/21/2013    Patient Active Problem List   Diagnosis Date Noted  . History of chlamydia 07/03/2016  . Chlamydia 06/11/2016  . Screening examination for STD (sexually transmitted disease) 06/04/2016  . Vaginal discharge 12/21/2013    History reviewed. No pertinent surgical history.   OB History    Gravida  2   Para  1   Term  1   Preterm      AB  1   Living  1     SAB      TAB  1   Ectopic      Multiple      Live Births  1            Home Medications    Prior to Admission medications   Medication Sig Start Date End Date Taking? Authorizing Provider  fluconazole (DIFLUCAN) 150 MG tablet Take 1 now and 1 in 3 days 07/01/17   Adline Potter, NP  metroNIDAZOLE (FLAGYL) 500 MG tablet Take 1 tablet (500 mg total) by mouth 2 (two) times daily. 06/19/17   Adline Potter, NP    Family History Family History  Problem Relation Age of Onset  . Cancer  Maternal Grandmother        breast, lung  . Cancer Maternal Grandfather        pancreatic    Social History Social History   Tobacco Use  . Smoking status: Current Every Day Smoker    Packs/day: 1.00    Years: 6.00    Pack years: 6.00    Types: Cigarettes  . Smokeless tobacco: Never Used  Substance Use Topics  . Alcohol use: Yes    Comment: sometimes  . Drug use: No     Allergies   Patient has no known allergies.   Review of Systems Review of Systems  Constitutional: Negative for fever.  Gastrointestinal: Positive for abdominal pain. Negative for diarrhea, nausea and vomiting.  Genitourinary: Positive for hematuria. Negative for dysuria.  All other systems reviewed and are negative.    Physical Exam Updated Vital Signs BP (!) 122/93 (BP Location: Right Arm)   Pulse 85   Temp 97.9 F (36.6 C) (Oral)   Resp 15   Ht 1.549 m ( )   Wt 96.2 kg (212 lb)   LMP 07/25/2017   SpO2 98%   BMI 40.06 kg/m   Physical Exam CONSTITUTIONAL: Well developed/well nourished HEAD: Normocephalic/atraumatic EYES: EOMI/PERRL ENMT:  Mucous membranes moist NECK: supple no meningeal signs SPINE/BACK:entire spine nontender CV: S1/S2 noted, no murmurs/rubs/gallops noted LUNGS: Lungs are clear to auscultation bilaterally, no apparent distress ABDOMEN: soft, nontender, no rebound or guarding, bowel sounds noted throughout abdomen GU:no cva tenderness NEURO: Pt is awake/alert/appropriate, moves all extremitiesx4.  No facial droop.   EXTREMITIES: pulses normal/equal, full ROM SKIN: warm, color normal PSYCH: no abnormalities of mood noted, alert and oriented to situation   ED Treatments / Results  Labs (all labs ordered are listed, but only abnormal results are displayed) Labs Reviewed  URINALYSIS, ROUTINE W REFLEX MICROSCOPIC - Abnormal; Notable for the following components:      Result Value   Color, Urine BROWN (*)    APPearance TURBID (*)    Specific Gravity, Urine >1.030  (*)    Hgb urine dipstick LARGE (*)    Bilirubin Urine MODERATE (*)    Ketones, ur TRACE (*)    Protein, ur 100 (*)    Nitrite POSITIVE (*)    Leukocytes, UA SMALL (*)    All other components within normal limits  URINALYSIS, MICROSCOPIC (REFLEX) - Abnormal; Notable for the following components:   Bacteria, UA MANY (*)    All other components within normal limits  URINE CULTURE  POC URINE PREG, ED    EKG None  Radiology No results found.  Procedures Procedures (including critical care time)  Medications Ordered in ED Medications  cephALEXin (KEFLEX) capsule 500 mg (500 mg Oral Given 08/06/17 0539)  ibuprofen (ADVIL,MOTRIN) tablet 400 mg (400 mg Oral Given 08/06/17 1610)     Initial Impression / Assessment and Plan / ED Course  I have reviewed the triage vital signs and the nursing notes.  Pertinent labs  results that were available during my care of the patient were reviewed by me and considered in my medical decision making (see chart for details).     5:19 AM Patient well-appearing.  No focal reproducible tenderness in her abdomen.  She declines pain medicine 5:48 AM Patient improved and resting comfortably. She has hematuria as well as signs of UTI. I did advise her that this could represent a kidney stone.  However she is feeling improved and is in no distress I offered to perform CT imaging to evaluate for any ureteral stone After discussion, patient declines imaging and prefers to be discharged. We will start Keflex for now.  Ibuprofen as needed for pain Advised to return for any worsening pain, fever over 100, or vomiting over the next 12 to 24 hours and she would need to have a CT abdomen pelvis At this time there is no focal abdominal tenderness.  My suspicion for appendicitis is low.  My suspicion for acute gynecologic emergency is low Final Clinical Impressions(s) / ED Diagnoses   Final diagnoses:  Acute cystitis with hematuria    ED Discharge Orders         Ordered    cephALEXin (KEFLEX) 500 MG capsule  2 times daily     08/06/17 0545       Zadie Rhine, MD 08/06/17 [phone]

## 2017-08-07 LAB — URINE CULTURE

## 2017-08-13 ENCOUNTER — Ambulatory Visit: Payer: Medicaid Other | Admitting: Advanced Practice Midwife

## 2017-09-09 ENCOUNTER — Other Ambulatory Visit: Payer: Self-pay

## 2017-09-09 ENCOUNTER — Ambulatory Visit: Payer: Medicaid Other | Admitting: Adult Health

## 2017-09-09 ENCOUNTER — Encounter: Payer: Self-pay | Admitting: Adult Health

## 2017-09-09 VITALS — BP 103/70 | HR 71 | Resp 18 | Ht 61.0 in | Wt 216.0 lb

## 2017-09-09 DIAGNOSIS — Z113 Encounter for screening for infections with a predominantly sexual mode of transmission: Secondary | ICD-10-CM | POA: Diagnosis not present

## 2017-09-09 DIAGNOSIS — N898 Other specified noninflammatory disorders of vagina: Secondary | ICD-10-CM | POA: Insufficient documentation

## 2017-09-09 DIAGNOSIS — Z01419 Encounter for gynecological examination (general) (routine) without abnormal findings: Secondary | ICD-10-CM

## 2017-09-09 DIAGNOSIS — Z0001 Encounter for general adult medical examination with abnormal findings: Secondary | ICD-10-CM

## 2017-09-09 DIAGNOSIS — Z202 Contact with and (suspected) exposure to infections with a predominantly sexual mode of transmission: Secondary | ICD-10-CM | POA: Diagnosis not present

## 2017-09-09 MED ORDER — AZITHROMYCIN 500 MG PO TABS: ORAL_TABLET | ORAL | 0 refills | Status: DC

## 2017-09-09 MED ORDER — METRONIDAZOLE 0.75 % VA GEL: 1.0000 | Freq: Every day | VAGINAL | 1 refills | Status: DC
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
## 2017-09-12 LAB — GC/CHLAMYDIA PROBE AMP
CHLAMYDIA, DNA PROBE: NEGATIVE
NEISSERIA GONORRHOEAE BY PCR: NEGATIVE

## 2017-12-04 ENCOUNTER — Emergency Department (HOSPITAL_COMMUNITY): Payer: Self-pay

## 2017-12-04 ENCOUNTER — Other Ambulatory Visit: Payer: Self-pay

## 2017-12-04 ENCOUNTER — Emergency Department (HOSPITAL_COMMUNITY)
Admission: EM | Admit: 2017-12-04 | Discharge: 2017-12-04 | Disposition: A | Discharge status: Home / Self Care | Payer: Self-pay | Attending: Emergency Medicine | Admitting: Emergency Medicine

## 2017-12-04 ENCOUNTER — Encounter (HOSPITAL_COMMUNITY): Payer: Self-pay | Admitting: Emergency Medicine

## 2017-12-04 DIAGNOSIS — Y929 Unspecified place or not applicable: Secondary | ICD-10-CM | POA: Insufficient documentation

## 2017-12-04 DIAGNOSIS — Y9389 Activity, other specified: Secondary | ICD-10-CM | POA: Insufficient documentation

## 2017-12-04 DIAGNOSIS — Y998 Other external cause status: Secondary | ICD-10-CM | POA: Insufficient documentation

## 2017-12-04 DIAGNOSIS — F17210 Nicotine dependence, cigarettes, uncomplicated: Secondary | ICD-10-CM | POA: Insufficient documentation

## 2017-12-04 DIAGNOSIS — S20211A Contusion of right front wall of thorax, initial encounter: Secondary | ICD-10-CM | POA: Insufficient documentation

## 2017-12-04 MED ORDER — IBUPROFEN 800 MG PO TABS
800.0000 mg | ORAL_TABLET | Freq: Once | ORAL | Status: AC
  Administered 2017-12-04: 800 mg via ORAL
  Filled 2017-12-04: qty 1

## 2017-12-04 MED ORDER — IBUPROFEN 600 MG PO TABS: 600.0000 mg | ORAL_TABLET | Freq: Four times a day (QID) | ORAL | 0 refills | Status: DC | PRN

## 2017-12-04 NOTE — Discharge Instructions (Signed)
X-rays normal, no signs of injury  Ibuprofen as needed every 8 hours  Emergency department for severe worsening symptoms
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
## 2018-01-05 ENCOUNTER — Ambulatory Visit (INDEPENDENT_AMBULATORY_CARE_PROVIDER_SITE_OTHER): Payer: Medicaid Other | Admitting: Obstetrics & Gynecology

## 2018-01-05 ENCOUNTER — Encounter: Payer: Self-pay | Admitting: Obstetrics & Gynecology

## 2018-01-05 VITALS — BP 112/68 | HR 84 | Ht 61.0 in | Wt 216.8 lb

## 2018-01-05 DIAGNOSIS — Z113 Encounter for screening for infections with a predominantly sexual mode of transmission: Secondary | ICD-10-CM

## 2018-01-05 DIAGNOSIS — Z7251 High risk heterosexual behavior: Secondary | ICD-10-CM | POA: Diagnosis not present
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
## 2018-01-06 LAB — HEP, RPR, HIV PANEL
HIV Screen 4th Generation wRfx: NONREACTIVE
Hepatitis B Surface Ag: NEGATIVE
RPR Ser Ql: NONREACTIVE

## 2018-01-07 LAB — GC/CHLAMYDIA PROBE AMP
Chlamydia trachomatis, NAA: NEGATIVE
NEISSERIA GONORRHOEAE BY PCR: NEGATIVE

## 2018-01-25 ENCOUNTER — Other Ambulatory Visit: Payer: Self-pay | Admitting: Adult Health

## 2018-01-25 MED ORDER — METRONIDAZOLE 0.75 % VA GEL: 1.0000 | Freq: Every day | VAGINAL | 1 refills | Status: DC

## 2018-01-25 NOTE — Progress Notes (Signed)
Refill metrogel  °

## 2018-02-08 ENCOUNTER — Ambulatory Visit: Payer: Medicaid Other | Admitting: Family Medicine

## 2018-02-11 ENCOUNTER — Ambulatory Visit (INDEPENDENT_AMBULATORY_CARE_PROVIDER_SITE_OTHER): Payer: Medicaid Other | Admitting: Women's Health

## 2018-02-11 ENCOUNTER — Encounter: Payer: Self-pay | Admitting: Women's Health

## 2018-02-11 VITALS — BP 109/67 | HR 85 | Ht 60.0 in | Wt 214.0 lb

## 2018-02-11 DIAGNOSIS — F17200 Nicotine dependence, unspecified, uncomplicated: Secondary | ICD-10-CM | POA: Diagnosis not present

## 2018-02-11 DIAGNOSIS — Z3202 Encounter for pregnancy test, result negative: Secondary | ICD-10-CM

## 2018-02-11 DIAGNOSIS — Z30011 Encounter for initial prescription of contraceptive pills: Secondary | ICD-10-CM | POA: Diagnosis not present

## 2018-02-11 LAB — POCT URINE PREGNANCY: PREG TEST UR: NEGATIVE

## 2018-02-11 MED ORDER — LO LOESTRIN FE 1 MG-10 MCG / 10 MCG PO TABS: 1.0000 | ORAL_TABLET | Freq: Every day | ORAL | 3 refills | Status: DC

## 2018-02-11 NOTE — Patient Instructions (Signed)
Oral Contraception Use Oral contraceptive pills (OCPs) are medicines taken to prevent pregnancy. OCPs work by preventing the ovaries from releasing eggs. The hormones in OCPs also cause the cervical mucus to thicken, preventing the sperm from entering the uterus. The hormones also cause the uterine lining to become thin, not allowing a fertilized egg to attach to the inside of the uterus. OCPs are highly effective when taken exactly as prescribed. However, OCPs do not prevent sexually transmitted diseases (STDs). Safe sex practices, such as using condoms along with an OCP, can help prevent STDs. Before taking OCPs, you may have a physical exam and Pap test. Your health care provider may also order blood tests if necessary. Your health care provider will make sure you are a good candidate for oral contraception. Discuss with your health care provider the possible side effects of the OCP you may be prescribed. When starting an OCP, it can take 2 to 3 months for the body to adjust to the changes in hormone levels in your body. How to take oral contraceptive pills Your health care provider may advise you on how to start taking the first cycle of OCPs. Otherwise, you can:  Start on day 1 of your menstrual period. You will not need any backup contraceptive protection with this start time.  Start on the first Sunday after your menstrual period or the day you get your prescription. In these cases, you will need to use backup contraceptive protection for the first week.  Start the pill at any time of your cycle. If you take the pill within 5 days of the start of your period, you are protected against pregnancy right away. In this case, you will not need a backup form of birth control. If you start at any other time of your menstrual cycle, you will need to use another form of birth control for 7 days. If your OCP is the type called a minipill, it will protect you from pregnancy after taking it for 2 days (48  hours).  After you have started taking OCPs:  If you forget to take 1 pill, take it as soon as you remember. Take the next pill at the regular time.  If you miss 2 or more pills, call your health care provider because different pills have different instructions for missed doses. Use backup birth control until your next menstrual period starts.  If you use a 28-day pack that contains inactive pills and you miss 1 of the last 7 pills (pills with no hormones), it will not matter. Throw away the rest of the non-hormone pills and start a new pill pack.  No matter which day you start the OCP, you will always start a new pack on that same day of the week. Have an extra pack of OCPs and a backup contraceptive method available in case you miss some pills or lose your OCP pack. Follow these instructions at home:  Do not smoke.  Always use a condom to protect against STDs. OCPs do not protect against STDs.  Use a calendar to mark your menstrual period days.  Read the information and directions that came with your OCP. Talk to your health care provider if you have questions. Contact a health care provider if:  You develop nausea and vomiting.  You have abnormal vaginal discharge or bleeding.  You develop a rash.  You miss your menstrual period.  You are losing your hair.  You need treatment for mood swings or depression.  You   get dizzy when taking the OCP.  You develop acne from taking the OCP.  You become pregnant. Get help right away if:  You develop chest pain.  You develop shortness of breath.  You have an uncontrolled or severe headache.  You develop numbness or slurred speech.  You develop visual problems.  You develop pain, redness, and swelling in the legs. This information is not intended to replace advice given to you by your health care provider. Make sure you discuss any questions you have with your health care provider. Document Released: 03/06/2011 Document  Revised: 08/23/2015 Document Reviewed: 09/05/2012 [employer] Interactive Patient Education  2017 [employer]  

## 2018-02-11 NOTE — Progress Notes (Signed)
   GYN VISIT Patient name: Valerie Greene MRN [id number]  Date of birth: 12/13/1995 Chief Complaint:   discuss bc (ad sex on 11-5)  History of Present Illness:   Valerie Greene is a 22 y.o. [id number] Caucasian female being seen today wanting to get on birth control pills. No h/o HTN, DVT/PE, CVA, MI, or migraines w/ aura.  Does smoke 1ppd.   Patient's last menstrual period was 02/01/2018. The current method of family planning is none. Last pap 06/04/16. Results were:  normal Review of Systems:   Pertinent items are noted in HPI Denies fever/chills, dizziness, headaches, visual disturbances, fatigue, shortness of breath, chest pain, abdominal pain, vomiting, abnormal vaginal discharge/itching/odor/irritation, problems with periods, bowel movements, urination, or intercourse unless otherwise stated above.  Pertinent History Reviewed:  Reviewed past medical,surgical, social, obstetrical and family history.  Reviewed problem list, medications and allergies. Physical Assessment:   Vitals:   02/11/18 1354  BP: 109/67  Pulse: 85  Weight: 214 lb (97.1 kg)  Height: 5' (1.524 m)  Body mass index is 41.79 kg/m.       Physical Examination:   General appearance: alert, well appearing, and in no distress  Mental status: alert, oriented to person, place, and time  Skin: warm & dry   Cardiovascular: normal heart rate noted  Respiratory: normal respiratory effort, no distress  Abdomen: soft, non-tender   Pelvic: examination not indicated  Extremities: no edema   Results for orders placed or performed in visit on 02/11/18 (from the past 24 hour(s))  POCT urine pregnancy   Collection Time: 02/11/18  2:03 PM  Result Value Ref Range   Preg Test, Ur Negative Negative    Assessment & Plan:  1) Contraception management> rx LoLoestrin 3pk w/ 3RF, f/u in 3mth, condoms always for STI prevention  2) Smoker> advised cessation. Discussed potential adverse effects of smoking w/ estrogen (MI, CVA,  DVT/PE, HTN).   Meds:  Meds ordered this encounter  Medications  . LO LOESTRIN FE 1 MG-10 MCG / 10 MCG tablet    Sig: Take 1 tablet by mouth daily.    Dispense:  3 Package    Refill:  3    For co-pay card, pt to text "Lo Loestrin Fe " to [phone]              Co-pay card must be run in second position  "other coverage code 3"  if denied d/t PA, step edit, or insurance denial    Order Specific Question:   Supervising Provider    Answer:   Duane LopeEURE, LUTHER H [[id number]]    Orders Placed This Encounter  Procedures  . POCT urine pregnancy    Return in about 3 months (around 05/14/2018) for F/U.  Cheral MarkerKimberly R Aydenn Gervin CNM, [facility]NP-BC 02/11/2018 2:12 PM

## 2018-04-07 ENCOUNTER — Ambulatory Visit: Payer: Medicaid Other | Admitting: Women's Health

## 2018-04-21 ENCOUNTER — Encounter (HOSPITAL_COMMUNITY): Payer: Self-pay | Admitting: *Deleted

## 2018-04-21 ENCOUNTER — Emergency Department (HOSPITAL_COMMUNITY)
Admission: EM | Admit: 2018-04-21 | Discharge: 2018-04-21 | Disposition: A | Discharge status: Home / Self Care | Payer: Medicaid Other | Attending: Emergency Medicine | Admitting: Emergency Medicine

## 2018-04-21 ENCOUNTER — Other Ambulatory Visit: Payer: Self-pay

## 2018-04-21 DIAGNOSIS — R82998 Other abnormal findings in urine: Secondary | ICD-10-CM | POA: Insufficient documentation

## 2018-04-21 DIAGNOSIS — Z5321 Procedure and treatment not carried out due to patient leaving prior to being seen by health care provider: Secondary | ICD-10-CM | POA: Insufficient documentation

## 2018-04-21 NOTE — ED Triage Notes (Signed)
Pt c/o foul odor to urine, dysuria and "irritation down there" x 1 week. Pt reports the dysuia an irritation has now stopped since coming to the hospital. Denies fever, abdominal pain, n/v.

## 2018-05-17 ENCOUNTER — Encounter: Payer: Self-pay | Admitting: *Deleted

## 2018-05-17 ENCOUNTER — Ambulatory Visit: Payer: Medicaid Other | Admitting: Women's Health

## 2018-05-20 ENCOUNTER — Encounter: Payer: Self-pay | Admitting: Advanced Practice Midwife

## 2018-05-20 ENCOUNTER — Ambulatory Visit (INDEPENDENT_AMBULATORY_CARE_PROVIDER_SITE_OTHER): Payer: Medicaid Other | Admitting: Advanced Practice Midwife

## 2018-05-20 VITALS — BP 107/68 | HR 67 | Ht 60.0 in | Wt 214.0 lb

## 2018-05-20 DIAGNOSIS — Z7251 High risk heterosexual behavior: Secondary | ICD-10-CM | POA: Insufficient documentation

## 2018-05-20 DIAGNOSIS — Z113 Encounter for screening for infections with a predominantly sexual mode of transmission: Secondary | ICD-10-CM

## 2018-05-20 NOTE — Progress Notes (Signed)
[facility] Visit  Patient name: Valerie Greene MRN [id number]  Date of birth: Mar 28, 1996  CC & HPI:  Valerie Greene is a 23 y.o. Caucasian female presenting today for STD screening. Has a new partner, has had unprotected intercourse.  Never started COCs, doesn't like "taking anything" d/t SE.  Uses condoms "sometimes".  Doesn't want to be pregnant   Pertinent History Reviewed:  Medical & Surgical Hx:   Past Medical History:  Diagnosis Date  . Chlamydia 06/11/2016  . Kidney stone   . Vaginal discharge 12/21/2013   History reviewed. No pertinent surgical history. Family History  Problem Relation Age of Onset  . Cancer Maternal Grandmother        breast, lung  . Cancer Maternal Grandfather        pancreatic   No current outpatient medications on file. Social History: Reviewed -  reports that she has been smoking cigarettes. She has a 6.00 pack-year smoking history. She has never used smokeless tobacco.  Review of Systems:   Constitutional: Negative for fever and chills Eyes: Negative for visual disturbances Respiratory: Negative for shortness of breath, dyspnea Cardiovascular: Negative for chest pain or palpitations  Gastrointestinal: Negative for vomiting, diarrhea and constipation; no abdominal pain Genitourinary: Negative for dysuria and urgency, vaginal irritation or itching Musculoskeletal: Negative for back pain, joint pain, myalgias  Neurological: Negative for dizziness and headaches    Objective Findings:    Physical Examination: Vitals:   05/20/18 0839  BP: 107/68  Pulse: 67   General appearance - well appearing, and in no distress Mental status - alert, oriented to person, place, and time Chest:  Normal respiratory effort Heart - normal rate and regular rhythm Abdomen:  Soft, nontender Pelvic: deferred Musculoskeletal:  Normal range of motion without pain Extremities:  No edema    No results found for this or any previous visit (from the  past 24 hour(s)).    Assessment & Plan:  A:    Orders Placed This Encounter  Procedures  . GC/Chlamydia Probe Amp  . Trichomonas vaginalis, RNA   Had neg HIV/RPR/Heb B a few months ago, declined today P:  Condoms strongly encouraged   No follow-ups on file.  Jacklyn Shell CNM 05/20/2018 8:53 AM

## 2018-05-23 ENCOUNTER — Other Ambulatory Visit: Payer: Self-pay | Admitting: Advanced Practice Midwife

## 2018-05-23 LAB — TRICHOMONAS VAGINALIS, PROBE AMP: TRICH VAG BY NAA: NEGATIVE

## 2018-05-23 LAB — GC/CHLAMYDIA PROBE AMP
Chlamydia trachomatis, NAA: POSITIVE — AB
Neisseria gonorrhoeae by PCR: NEGATIVE

## 2018-05-23 MED ORDER — AZITHROMYCIN 500 MG PO TABS: 1000.0000 mg | ORAL_TABLET | Freq: Once | ORAL | 0 refills | Status: AC

## 2018-05-23 NOTE — Progress Notes (Signed)
Azithromycin 1ghm for chl

## 2018-06-15 ENCOUNTER — Other Ambulatory Visit: Payer: Medicaid Other

## 2018-06-15 ENCOUNTER — Ambulatory Visit: Payer: Medicaid Other | Admitting: Advanced Practice Midwife

## 2018-06-15 ENCOUNTER — Other Ambulatory Visit: Payer: Self-pay

## 2018-06-15 DIAGNOSIS — Z8619 Personal history of other infectious and parasitic diseases: Secondary | ICD-10-CM

## 2018-06-16 LAB — GC/CHLAMYDIA PROBE AMP
CHLAMYDIA, DNA PROBE: NEGATIVE
Neisseria gonorrhoeae by PCR: NEGATIVE

## 2018-06-29 ENCOUNTER — Telehealth: Payer: Self-pay | Admitting: Adult Health

## 2018-06-29 ENCOUNTER — Telehealth: Payer: Self-pay | Admitting: *Deleted

## 2018-06-29 NOTE — Telephone Encounter (Signed)
LMOVM returning pts call.  

## 2018-06-29 NOTE — Telephone Encounter (Signed)
Patient called, stated that her left breast is really sore, no lumps, no drainage and not hot to the touch.  She stated it has been bothering her for a couple of days.  She has Medicaid FP, which will not pay for a visit, she had her las P&P on 09/10/2018.  [phone] or send a message in mychart per the pt

## 2018-09-19 ENCOUNTER — Emergency Department (HOSPITAL_COMMUNITY)
Admission: EM | Admit: 2018-09-19 | Discharge: 2018-09-19 | Disposition: A | Discharge status: Home / Self Care | Payer: Medicaid Other | Attending: Emergency Medicine | Admitting: Emergency Medicine

## 2018-09-19 ENCOUNTER — Other Ambulatory Visit: Payer: Self-pay

## 2018-09-19 ENCOUNTER — Encounter (HOSPITAL_COMMUNITY): Payer: Self-pay | Admitting: Emergency Medicine

## 2018-09-19 DIAGNOSIS — F17210 Nicotine dependence, cigarettes, uncomplicated: Secondary | ICD-10-CM | POA: Insufficient documentation

## 2018-09-19 DIAGNOSIS — Z041 Encounter for examination and observation following transport accident: Secondary | ICD-10-CM | POA: Insufficient documentation
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
## 2018-10-12 ENCOUNTER — Telehealth: Payer: Self-pay | Admitting: Adult Health

## 2018-10-12 NOTE — Telephone Encounter (Signed)
Pt would like to get an appt to have a STD check.

## 2018-10-25 ENCOUNTER — Encounter: Payer: Self-pay | Admitting: Advanced Practice Midwife

## 2018-10-25 ENCOUNTER — Other Ambulatory Visit: Payer: Self-pay

## 2018-10-25 ENCOUNTER — Emergency Department (HOSPITAL_COMMUNITY)
Admission: EM | Admit: 2018-10-25 | Discharge: 2018-10-25 | Discharge status: Against Medical Advice | Payer: Medicaid Other

## 2018-10-26 ENCOUNTER — Telehealth: Payer: Self-pay | Admitting: *Deleted

## 2018-10-26 NOTE — Telephone Encounter (Signed)
Pt has regular periods. Pt is 8 days late. Pt is not on any birth control. Pt has taken 7 pregnancy tests and all have been negative. Pt has a clear vaginal discharge with no odor. Pt has taken 2 Plan B's in the last 2 months. I advised maybe that's what's going on. I advised to give it more time. If no period in another week, take another test. Let us know what results are. Pt voiced understanding. [place]

## 2018-11-03 ENCOUNTER — Ambulatory Visit (INDEPENDENT_AMBULATORY_CARE_PROVIDER_SITE_OTHER): Payer: Self-pay | Admitting: Obstetrics and Gynecology

## 2018-11-03 ENCOUNTER — Encounter: Payer: Self-pay | Admitting: Obstetrics and Gynecology

## 2018-11-03 ENCOUNTER — Other Ambulatory Visit: Payer: Self-pay | Admitting: Obstetrics & Gynecology

## 2018-11-03 ENCOUNTER — Other Ambulatory Visit: Payer: Self-pay

## 2018-11-03 VITALS — BP 124/76 | HR 90 | Ht 60.0 in | Wt 215.6 lb

## 2018-11-03 DIAGNOSIS — Z3202 Encounter for pregnancy test, result negative: Secondary | ICD-10-CM

## 2018-11-03 DIAGNOSIS — N926 Irregular menstruation, unspecified: Secondary | ICD-10-CM | POA: Insufficient documentation

## 2018-11-03 LAB — POCT URINE PREGNANCY: Preg Test, Ur: NEGATIVE

## 2018-11-03 MED ORDER — PROGESTERONE MICRONIZED 200 MG PO CAPS: 200.0000 mg | ORAL_CAPSULE | Freq: Every day | ORAL | 1 refills | Status: DC

## 2018-11-03 NOTE — Patient Instructions (Signed)
Abnormal Uterine Bleeding °Abnormal uterine bleeding means bleeding more than usual from your uterus. It can include: °· Bleeding between periods. °· Bleeding after sex. °· Bleeding that is heavier than normal. °· Periods that last longer than usual. °· Bleeding after you have stopped having your period (menopause). °There are many problems that may cause this. You should see a doctor for any kind of bleeding that is not normal. Treatment depends on the cause of the bleeding. °Follow these instructions at home: °· Watch your condition for any changes. °· Do not use tampons, douche, or have sex, if your doctor tells you not to. °· Change your pads often. °· Get regular well-woman exams. Make sure they include a pelvic exam and cervical cancer screening. °· Keep all follow-up visits as told by your doctor. This is important. °Contact a doctor if: °· The bleeding lasts more than one week. °· You feel dizzy at times. °· You feel like you are going to throw up (nauseous). °· You throw up. °Get help right away if: °· You pass out. °· You have to change pads every hour. °· You have belly (abdominal) pain. °· You have a fever. °· You get sweaty. °· You get weak. °· You passing large blood clots from your vagina. °Summary °· Abnormal uterine bleeding means bleeding more than usual from your uterus. °· There are many problems that may cause this. You should see a doctor for any kind of bleeding that is not normal. °· Treatment depends on the cause of the bleeding. °This information is not intended to replace advice given to you by your health care provider. Make sure you discuss any questions you have with your health care provider. °Document Released: 01/12/2009 Document Revised: 03/11/2016 Document Reviewed: 03/11/2016 °Elsevier Patient Education © 2020 [employer] Inc[employer] ° °

## 2018-11-03 NOTE — Progress Notes (Signed)
Ms Valerie Greene presents with c/o missed period. She reports regular monthly cycles LMP 09/20/18 Several negative Home UPT's Sexual active without contraception No new medications, stress, wt loss or gain  PE AF VSS Lungs clear Heart RRR Abd soft + BS obese  A/P Missed period  Office UPT negative Will check TSH Withdraw with progesterone To call office if no bleeding/cycle after progesterone.

## 2018-11-04 LAB — TSH: TSH: 1.65 u[IU]/mL (ref 0.450–4.500)

## 2018-11-08 ENCOUNTER — Other Ambulatory Visit: Payer: Self-pay | Admitting: Women's Health

## 2018-11-08 MED ORDER — FLUCONAZOLE 150 MG PO TABS: 150.0000 mg | ORAL_TABLET | Freq: Once | ORAL | 0 refills | Status: AC

## 2019-01-27 ENCOUNTER — Other Ambulatory Visit (INDEPENDENT_AMBULATORY_CARE_PROVIDER_SITE_OTHER): Payer: Self-pay

## 2019-01-27 ENCOUNTER — Other Ambulatory Visit (HOSPITAL_COMMUNITY)
Admission: RE | Admit: 2019-01-27 | Discharge: 2019-01-27 | Disposition: A | Discharge status: Home / Self Care | Payer: Medicaid Other | Source: Ambulatory Visit | Attending: Obstetrics & Gynecology | Admitting: Obstetrics & Gynecology

## 2019-01-27 ENCOUNTER — Other Ambulatory Visit: Payer: Self-pay

## 2019-01-27 DIAGNOSIS — Z113 Encounter for screening for infections with a predominantly sexual mode of transmission: Secondary | ICD-10-CM | POA: Insufficient documentation

## 2019-01-27 NOTE — Progress Notes (Addendum)
   NURSE VISIT- VAGINITIS/STD/POC  SUBJECTIVE:  Valerie Greene is a 23 y.o. [id number] GYN patientfemale here for a vaginal swab for STD screen.  She reports the following symptoms: none Denies abnormal vaginal bleeding, significant pelvic pain, fever, or UTI symptoms.  OBJECTIVE:  There were no vitals taken for this visit.  Appears well, in no apparent distress  ASSESSMENT: Vaginal swab for STD screen  PLAN: Self-collected vaginal probe for Gonorrhea, Chlamydia, Trichomonas sent to lab Treatment: to be determined once results are received Follow-up as needed if symptoms persist/worsen, or new symptoms develop  Rolena Infante  01/27/2019 11:40 AM   Chart reviewed for nurse visit. Agree with plan of care.  Christin Fudge, [place] 01/27/2019 1:58 PM

## 2019-01-28 LAB — CERVICOVAGINAL ANCILLARY ONLY
Chlamydia: NEGATIVE
Comment: NEGATIVE
Comment: NEGATIVE
Comment: NORMAL
Neisseria Gonorrhea: NEGATIVE
Trichomonas: NEGATIVE

## 2019-06-10 ENCOUNTER — Emergency Department (HOSPITAL_COMMUNITY)
Admission: EM | Admit: 2019-06-10 | Discharge: 2019-06-11 | Disposition: A | Discharge status: Home / Self Care | Payer: Self-pay | Attending: Emergency Medicine | Admitting: Emergency Medicine

## 2019-06-10 ENCOUNTER — Other Ambulatory Visit: Payer: Self-pay

## 2019-06-10 ENCOUNTER — Emergency Department (HOSPITAL_COMMUNITY): Payer: Self-pay

## 2019-06-10 ENCOUNTER — Encounter (HOSPITAL_COMMUNITY): Payer: Self-pay | Admitting: Emergency Medicine

## 2019-06-10 DIAGNOSIS — F17210 Nicotine dependence, cigarettes, uncomplicated: Secondary | ICD-10-CM | POA: Insufficient documentation

## 2019-06-10 DIAGNOSIS — R0789 Other chest pain: Secondary | ICD-10-CM

## 2019-06-10 DIAGNOSIS — R0602 Shortness of breath: Secondary | ICD-10-CM | POA: Insufficient documentation

## 2019-06-10 LAB — CBC WITH DIFFERENTIAL/PLATELET
Abs Immature Granulocytes: 0.03 10*3/uL (ref 0.00–0.07)
Basophils Absolute: 0.1 10*3/uL (ref 0.0–0.1)
Basophils Relative: 1 %
Eosinophils Absolute: 0.3 10*3/uL (ref 0.0–0.5)
Eosinophils Relative: 3 %
HCT: 41.3 % (ref 36.0–46.0)
Hemoglobin: 13.8 g/dL (ref 12.0–15.0)
Immature Granulocytes: 0 %
Lymphocytes Relative: 30 %
Lymphs Abs: 3.3 10*3/uL (ref 0.7–4.0)
MCH: 31.2 pg (ref 26.0–34.0)
MCHC: 33.4 g/dL (ref 30.0–36.0)
MCV: 93.4 fL (ref 80.0–100.0)
Monocytes Absolute: 1 10*3/uL (ref 0.1–1.0)
Monocytes Relative: 10 %
Neutro Abs: 6.2 10*3/uL (ref 1.7–7.7)
Neutrophils Relative %: 56 %
Platelets: 290 10*3/uL (ref 150–400)
RBC: 4.42 MIL/uL (ref 3.87–5.11)
RDW: 12.3 % (ref 11.5–15.5)
WBC: 11 10*3/uL — ABNORMAL HIGH (ref 4.0–10.5)
nRBC: 0 % (ref 0.0–0.2)

## 2019-06-10 LAB — BASIC METABOLIC PANEL
Anion gap: 8 (ref 5–15)
BUN: 14 mg/dL (ref 6–20)
CO2: 23 mmol/L (ref 22–32)
Calcium: 9.2 mg/dL (ref 8.9–10.3)
Chloride: 105 mmol/L (ref 98–111)
Creatinine, Ser: 0.66 mg/dL (ref 0.44–1.00)
GFR calc Af Amer: >60 mL/min (ref >60)
GFR calc non Af Amer: >60 mL/min (ref >60)
Glucose, Bld: 92 mg/dL (ref 70–99)
Potassium: 3.6 mmol/L (ref 3.5–5.1)
Sodium: 136 mmol/L (ref 135–145)

## 2019-06-10 NOTE — ED Provider Notes (Signed)
[facility] EMERGENCY DEPARTMENT Provider Note   CSN: [id number] Arrival date & time: 06/10/19  2158   Time seen 11:05 PM  History Chief Complaint  Patient presents with  . Chest Pain    Valerie Greene is a 24 y.o. female.  HPI   Patient states for the past 2 days, starting March 10 she has had a "weird feeling" that is not a pain in her left chest.  She states it comes and goes and it lasts for a few seconds.  Nothing she does makes it come on, nothing she does makes it feel better.  It happens about once an hour.  She denies any soreness to her chest although she states her left breast is sore like before she has her menses (she states is always her left breast that is sore).  She has chronic shortness of breath and smokes 12 cigarettes a day.  She denies cough, fever, rhinorrhea, sore throat, nausea, vomiting, diaphoresis, feeling dizzy or lightheaded, palpitations, or increased stress.  She does admit she has a lot of anxiety.  She states she went online tonight and felt like after reading what was on the Internet that she was having a heart attack.  She denies any family history of heart problems.  PCP Practice, [facility]   Past Medical History:  Diagnosis Date  . Chlamydia 06/11/2016  . Kidney stone   . Vaginal discharge 12/21/2013    Patient Active Problem List   Diagnosis Date Noted  . Missed period 11/03/2018  . High risk heterosexual behavior 05/20/2018  . Smoker 02/11/2018    History reviewed. No pertinent surgical history.   OB History    Gravida  2   Para  1   Term  1   Preterm      AB  1   Living  1     SAB      TAB  1   Ectopic      Multiple      Live Births  1           Family History  Problem Relation Age of Onset  . Cancer Maternal Grandmother        breast, lung  . Cancer Maternal Grandfather        pancreatic    Social History   Tobacco Use  . Smoking status: Current Every Day Smoker    Packs/day: 1.00   Years: 6.00    Pack years: 6.00    Types: Cigarettes  . Smokeless tobacco: Never Used  Substance Use Topics  . Alcohol use: Yes    Comment: sometimes  . Drug use: No  employed Smokes 12 cigs a day  Home Medications Prior to Admission medications   Medication Sig Start Date End Date Taking? Authorizing Provider  progesterone (PROMETRIUM) 200 MG capsule Take 1 capsule (200 mg total) by mouth daily for 10 days. 11/03/18 11/13/18  Hermina Staggers, MD  NONE  Allergies    Patient has no known allergies.  Review of Systems   Review of Systems  All other systems reviewed and are negative.   Physical Exam Updated Vital Signs BP 108/67   Pulse 75   Temp 98.6 F (37 C) (Oral)   Resp 18   Ht 5' (1.524 m)   Wt 93.4 kg   LMP 05/30/2019   SpO2 99%   BMI 40.23 kg/m    Vital signs normal    Physical Exam Vitals and nursing note reviewed.  Constitutional:      General: She is not in acute distress.    Appearance: Normal appearance. She is well-developed. She is not ill-appearing or toxic-appearing.  HENT:     Head: Normocephalic and atraumatic.     Right Ear: External ear normal.     Left Ear: External ear normal.     Nose: Nose normal. No mucosal edema or rhinorrhea.     Mouth/Throat:     Dentition: No dental abscesses.     Pharynx: No uvula swelling.  Eyes:     Conjunctiva/sclera: Conjunctivae normal.     Pupils: Pupils are equal, round, and reactive to light.  Cardiovascular:     Rate and Rhythm: Normal rate and regular rhythm.     Heart sounds: Normal heart sounds. No murmur. No friction rub. No gallop.   Pulmonary:     Effort: Pulmonary effort is normal. No respiratory distress.     Breath sounds: Normal breath sounds. No wheezing, rhonchi or rales.  Chest:     Chest wall: No tenderness or crepitus.       Comments: Area of discomfort noted Musculoskeletal:        General: No tenderness. Normal range of motion.     Cervical back: Full passive range of motion  without pain, normal range of motion and neck supple.     Comments: Moves all extremities well.   Skin:    General: Skin is warm and dry.     Coloration: Skin is not pale.     Findings: No erythema or rash.  Neurological:     General: No focal deficit present.     Mental Status: She is alert and oriented to person, place, and time.     Cranial Nerves: No cranial nerve deficit.  Psychiatric:        Mood and Affect: Mood is anxious.        Speech: Speech normal.        Behavior: Behavior normal.     ED Results / Procedures / Treatments   Labs (all labs ordered are listed, but only abnormal results are displayed) Results for orders placed or performed during the hospital encounter of 06/10/19  CBC with Differential  Result Value Ref Range   WBC 11.0 (H) 4.0 - 10.5 K/uL   RBC 4.42 3.87 - 5.11 MIL/uL   Hemoglobin 13.8 12.0 - 15.0 g/dL   HCT 60.4 54.0 - 98.1 %   MCV 93.4 80.0 - 100.0 fL   MCH 31.2 26.0 - 34.0 pg   MCHC 33.4 30.0 - 36.0 g/dL   RDW 19.1 47.8 - 29.5 %   Platelets 290 150 - 400 K/uL   nRBC 0.0 0.0 - 0.2 %   Neutrophils Relative % 56 %   Neutro Abs 6.2 1.7 - 7.7 K/uL   Lymphocytes Relative 30 %   Lymphs Abs 3.3 0.7 - 4.0 K/uL   Monocytes Relative 10 %   Monocytes Absolute 1.0 0.1 - 1.0 K/uL   Eosinophils Relative 3 %   Eosinophils Absolute 0.3 0.0 - 0.5 K/uL   Basophils Relative 1 %   Basophils Absolute 0.1 0.0 - 0.1 K/uL   Immature Granulocytes 0 %   Abs Immature Granulocytes 0.03 0.00 - 0.07 K/uL  Basic metabolic panel  Result Value Ref Range   Sodium 136 135 - 145 mmol/L   Potassium 3.6 3.5 - 5.1 mmol/L   Chloride 105 98 - 111 mmol/L   CO2 23 22 - 32 mmol/L  Glucose, Bld 92 70 - 99 mg/dL   BUN 14 6 - 20 mg/dL   Creatinine, Ser 0.66 0.44 - 1.00 mg/dL   Calcium 9.2 8.9 - 10.3 mg/dL   GFR calc non Af Amer >60 >60 mL/min   GFR calc Af Amer >60 >60 mL/min   Anion gap 8 5 - 15  Troponin I (High Sensitivity)  Result Value Ref Range   Troponin I (High  Sensitivity) <2 <18 ng/L   Laboratory interpretation all normal except leukocytosis    EKG EKG Interpretation  Date/Time:  Friday June 10 2019 22:22:37 EST Ventricular Rate:  84 PR Interval:    QRS Duration: 89 QT Interval:  349 QTC Calculation: 413 R Axis:   69 Text Interpretation: Sinus rhythm Confirmed by Madalyn Rob [phone]) on 06/10/2019 10:35:11 PM   Radiology DG Chest 2 View  Result Date: 06/10/2019 CLINICAL DATA:  Chest pain for 2 days on the left EXAM: CHEST - 2 VIEW COMPARISON:  12/04/2017 FINDINGS: Cardiac shadow is within normal limits. The lungs are well aerated bilaterally. No focal infiltrate or sizable effusion is seen. No bony abnormality is noted. IMPRESSION: No acute abnormality seen. Electronically Signed   By: Inez Catalina M.D.   On: 06/10/2019 23:39    Procedures Procedures (including critical care time)  Medications Ordered in ED Medications - No data to display  ED Course  I have reviewed the triage vital signs and the nursing notes.  Pertinent labs & imaging results that were available during my care of the patient were reviewed by me and considered in my medical decision making (see chart for details).    MDM Rules/Calculators/A&P                      I reassured the patient she was not having a heart attack, generally pain only or discomfort that lasts a few seconds is nothing of clinical significance.  She denies any associated symptoms.  Patient was reassured that her symptoms do not sound like a heart attack, she is only having discomfort for a few seconds at a time.  Her pulse ox is 100% during my exam and her blood pressure is well within normal.  Her respiratory rate is normal and she is not tachypneic.  Chest x-ray and laboratory testing was done.  Her EKG was normal.  Recheck at time of discharge patient states her symptoms feel better since she was reassured she was not having heart attack.  We discussed symptoms to return such as  constant pain for more than 20 to 30 minutes, coughing, or fever.  She can try taking ibuprofen over-the-counter to see if that helps with the occurrence of her discomfort.  Final Clinical Impression(s) / ED Diagnoses Final diagnoses:  Atypical chest pain    Rx / DC Orders ED Discharge Orders    None    OTC ibuprofen  Plan discharge  Rolland Porter, MD, Barbette Or, MD 06/11/19 [phone]

## 2019-06-10 NOTE — ED Triage Notes (Signed)
Pt started having chest pain on left side about 2 days ago.  Denies injury.  Pain does not radiate.  Describes as a dull pain.

## 2019-06-11 LAB — TROPONIN I (HIGH SENSITIVITY): Troponin I (High Sensitivity): <2 ng/L (ref <18)

## 2019-06-11 NOTE — Discharge Instructions (Addendum)
Take ibuprofen 400 mg 4 times a day for discomfort. Recheck if you get constant pain lasting more than 20-30 minutes, cough, fever or seem worse.

## 2019-07-22 ENCOUNTER — Encounter: Payer: Self-pay | Admitting: Adult Health

## 2019-07-22 ENCOUNTER — Ambulatory Visit (INDEPENDENT_AMBULATORY_CARE_PROVIDER_SITE_OTHER): Payer: Medicaid Other | Admitting: Adult Health

## 2019-07-22 ENCOUNTER — Other Ambulatory Visit: Payer: Self-pay

## 2019-07-22 ENCOUNTER — Other Ambulatory Visit (HOSPITAL_COMMUNITY)
Admission: RE | Admit: 2019-07-22 | Discharge: 2019-07-22 | Disposition: A | Discharge status: Home / Self Care | Payer: Medicaid Other | Source: Ambulatory Visit | Attending: Adult Health | Admitting: Adult Health

## 2019-07-22 VITALS — BP 96/67 | HR 72 | Ht 61.0 in | Wt 203.0 lb

## 2019-07-22 DIAGNOSIS — Z113 Encounter for screening for infections with a predominantly sexual mode of transmission: Secondary | ICD-10-CM | POA: Diagnosis not present

## 2019-07-22 DIAGNOSIS — Z3202 Encounter for pregnancy test, result negative: Secondary | ICD-10-CM

## 2019-07-22 DIAGNOSIS — Z01419 Encounter for gynecological examination (general) (routine) without abnormal findings: Secondary | ICD-10-CM | POA: Diagnosis not present

## 2019-07-22 DIAGNOSIS — Z3009 Encounter for other general counseling and advice on contraception: Secondary | ICD-10-CM | POA: Insufficient documentation

## 2019-07-22 LAB — POCT URINE PREGNANCY: Preg Test, Ur: NEGATIVE

## 2019-07-22 NOTE — Progress Notes (Signed)
Patient ID: Valerie Greene, female   DOB: January 23, 1996, 24 y.o.   MRN: [id number] History of Present Illness:  Valerie Greene is a 24 year old white female, single,G2P1011, in for well woman gyn exam and pap. PCP is [facility].  Current Medications, Allergies, Past Medical History, Past Surgical History, Family History and Social History were reviewed in [employer] record.     Review of Systems:  Patient denies any headaches, hearing loss, fatigue, blurred vision, shortness of breath, chest pain, abdominal pain, problems with bowel movements, urination, or intercourse. No joint pain or mood swings. She has no birth control and declines any.   Physical Exam:BP 96/67 (BP Location: Left Arm, Patient Position: Sitting, Cuff Size: Large)   Pulse 72   Ht 5\' 1"  (1.549 m)   Wt 203 lb (92.1 kg)   LMP 07/18/2019   BMI 38.36 kg/m UPT is negative  General:  Well developed, well nourished, no acute distress Skin:  Warm and dry,tan Neck:  Midline trachea, normal thyroid, good ROM, no lymphadenopathy Lungs; Clear to auscultation bilaterally Breast:  No dominant palpable mass, retraction, or nipple discharge Cardiovascular: Regular rate and rhythm Abdomen:  Soft, non tender, no hepatosplenomegaly Pelvic:  External genitalia is normal in appearance, no lesions.  The vagina is normal in appearance. Urethra has no lesions or masses. The cervix is bulbous and smooth, pap with GC/CHL and high risk HPV 16/18 genotyping performed.  Uterus is felt to be normal size, shape, and contour.  No adnexal masses or tenderness noted.Bladder is non tender, no masses felt. Extremities/musculoskeletal:  No swelling or varicosities noted, no clubbing or cyanosis Psych:  No mood changes, alert and cooperative,seems happy AA is 6 Fall risk is low PHQ 9 score s 0 Examination chaperoned by 07/20/2019 CMA.  Impression and Plan:  1. Pregnancy test negative  2. Encounter for gynecological examination with  Papanicolaou smear of cervix Pap sent Physical in 1 year Pap in 3 if normal   3. Family planning Check HIV and RPR  4. Screening examination for STD (sexually transmitted disease) Check HIV and RPR

## 2019-07-23 LAB — RPR: RPR Ser Ql: NONREACTIVE

## 2019-07-23 LAB — HIV ANTIBODY (ROUTINE TESTING W REFLEX): HIV Screen 4th Generation wRfx: NONREACTIVE

## 2019-07-26 LAB — CYTOLOGY - PAP
Chlamydia: NEGATIVE
Comment: NEGATIVE
Comment: NEGATIVE
Comment: NORMAL
Diagnosis: NEGATIVE
High risk HPV: NEGATIVE
Neisseria Gonorrhea: NEGATIVE

## 2019-11-09 ENCOUNTER — Encounter: Payer: Self-pay | Admitting: Emergency Medicine

## 2019-11-09 ENCOUNTER — Other Ambulatory Visit: Payer: Self-pay

## 2019-11-09 ENCOUNTER — Ambulatory Visit
Admission: EM | Admit: 2019-11-09 | Discharge: 2019-11-09 | Disposition: A | Discharge status: Home / Self Care | Payer: Medicaid Other | Attending: Emergency Medicine | Admitting: Emergency Medicine

## 2019-11-09 ENCOUNTER — Ambulatory Visit: Payer: Self-pay

## 2019-11-09 DIAGNOSIS — J039 Acute tonsillitis, unspecified: Secondary | ICD-10-CM

## 2019-11-09 MED ORDER — PREDNISONE 20 MG PO TABS: 20.0000 mg | ORAL_TABLET | Freq: Two times a day (BID) | ORAL | 0 refills | Status: AC

## 2019-11-09 NOTE — ED Provider Notes (Signed)
[facility]   [id number] 11/09/19 Arrival Time: 1849  CC: Tonsil swollen  SUBJECTIVE: History from: patient.  Valerie Greene is a 24 y.o. female who presents with LT tonsillar swelling x 3 weeks.  Denies sick exposure to strep, flu or mono, or precipitating event.  Does admit to tobacco use, 1 PPD x 10 years.  Denies alleviating factors.  Symptoms are made worse with swallowing, but tolerating liquids and own secretions without difficulty.  Denies previous symptoms in the past.   Denies fever, chills, night sweats, weight-loss, fatigue, ear pain, sinus pain, rhinorrhea, nasal congestion, cough, SOB, wheezing, chest pain, nausea, rash, changes in bowel or bladder habits.     ROS: As per HPI.  All other pertinent ROS negative.     Past Medical History:  Diagnosis Date  . Chlamydia 06/11/2016  . Kidney stone   . Vaginal discharge 12/21/2013   History reviewed. No pertinent surgical history. No Known Allergies No current facility-administered medications on file prior to encounter.   No current outpatient medications on file prior to encounter.   Social History   Socioeconomic History  . Marital status: Single    Spouse name: Not on file  . Number of children: 1  . Years of education: Not on file  . Highest education level: Not on file  Occupational History  . Not on file  Tobacco Use  . Smoking status: Current Every Day Smoker    Packs/day: 1.00    Years: 6.00    Pack years: 6.00    Types: Cigarettes  . Smokeless tobacco: Never Used  Vaping Use  . Vaping Use: Never used  Substance and Sexual Activity  . Alcohol use: Yes    Comment: sometimes  . Drug use: No  . Sexual activity: Yes    Birth control/protection: None  Other Topics Concern  . Not on file  Social History Narrative  . Not on file   Social Determinants of Health   Financial Resource Strain: Low Risk   . Difficulty of Paying Living Expenses: Not hard at all  Food Insecurity: No Food  Insecurity  . Worried About Programme researcher, broadcasting/film/video in the Last Year: Never true  . Ran Out of Food in the Last Year: Never true  Transportation Needs: No Transportation Needs  . Lack of Transportation (Medical): No  . Lack of Transportation (Non-Medical): No  Physical Activity: Sufficiently Active  . Days of Exercise per Week: 3 days  . Minutes of Exercise per Session: 100 min  Stress: No Stress Concern Present  . Feeling of Stress : Not at all  Social Connections: Moderately Integrated  . Frequency of Communication with Friends and Family: More than three times a week  . Frequency of Social Gatherings with Friends and Family: More than three times a week  . Attends Religious Services: 1 to 4 times per year  . Active Member of Clubs or Organizations: No  . Attends Banker Meetings: Never  . Marital Status: Living with partner  Intimate Partner Violence: Not At Risk  . Fear of Current or Ex-Partner: No  . Emotionally Abused: No  . Physically Abused: No  . Sexually Abused: No   Family History  Problem Relation Age of Onset  . Cancer Maternal Grandmother        breast, lung  . Cancer Maternal Grandfather        pancreatic    OBJECTIVE:  Vitals:   11/09/19 1911 11/09/19 1913  BP:  104/71  Pulse:  87  Resp:  18  Temp:  98.3 F (36.8 C)  TempSrc:  Oral  SpO2:  94%  Weight: 210 lb (95.3 kg)   Height: 5\' 1"  (1.549 m)      General appearance: alert; well-appearing, nontoxic; speaking in full sentences and tolerating own secretions HEENT: NCAT; Ears: EACs clear, TMs pearly gray; Eyes: PERRL.  EOM grossly intact.Nose: nares patent without rhinorrhea, Throat: oropharynx clear, tonsils non erythematous, LT tonsil appears enlarged 3+, uvula midline  Neck: supple  Lungs: unlabored respirations, symmetrical air entry; cough: absent; no respiratory distress; CTAB Heart: regular rate and rhythm.  Skin: warm and dry Psychological: alert and cooperative; normal mood and  affect   ASSESSMENT & PLAN:  1. Acute tonsillitis, unspecified etiology     Meds ordered this encounter  Medications  . predniSONE (DELTASONE) 20 MG tablet    Sig: Take 1 tablet (20 mg total) by mouth 2 (two) times daily with a meal for 5 days.    Dispense:  10 tablet    Refill:  0    Order Specific Question:   Supervising Provider    Answer:   Eustace Moore    Get plenty of rest and push fluids Steroid prescribed.  Take as directed and to completion Drink warm or cool liquids, use throat lozenges, or popsicles to help alleviate symptoms Take OTC ibuprofen or tylenol as needed for pain Follow up with ENT for further evaluation and management, may need blood work, imaging, and potentially biopsy/ tonsilectomy Return or go to ER if patient has any new or worsening symptoms such as fever, chills, nausea, vomiting, sore throat, difficulty breathing, trouble swallowing, cough, abdominal pain, chest pain, changes in bowel or bladder habits, etc...  Reviewed expectations re: course of current medical issues. Questions answered. Outlined signs and symptoms indicating need for more acute intervention. Patient verbalized understanding. After Visit Summary given.        [[id number]], PA-C 11/09/19 1934

## 2019-11-09 NOTE — ED Triage Notes (Signed)
Swollen LT tonsil x 3 weeks. Denies any pain

## 2019-11-09 NOTE — Discharge Instructions (Signed)
Get plenty of rest and push fluids Steroid prescribed.  Take as directed and to completion Drink warm or cool liquids, use throat lozenges, or popsicles to help alleviate symptoms Take OTC ibuprofen or tylenol as needed for pain Follow up with ENT for further evaluation and management, may need blood work, imaging, and potentially biopsy/ tonsilectomy Return or go to ER if patient has any new or worsening symptoms such as fever, chills, nausea, vomiting, sore throat, difficulty breathing, trouble swallowing, cough, abdominal pain, chest pain, changes in bowel or bladder habits, etc..Marland Kitchen

## 2019-11-27 ENCOUNTER — Other Ambulatory Visit: Payer: Self-pay

## 2019-11-27 ENCOUNTER — Emergency Department (HOSPITAL_COMMUNITY)
Admission: EM | Admit: 2019-11-27 | Discharge: 2019-11-27 | Disposition: A | Discharge status: Home / Self Care | Payer: Medicaid Other | Attending: Emergency Medicine | Admitting: Emergency Medicine

## 2019-11-27 ENCOUNTER — Encounter (HOSPITAL_COMMUNITY): Payer: Self-pay

## 2019-11-27 DIAGNOSIS — Y92009 Unspecified place in unspecified non-institutional (private) residence as the place of occurrence of the external cause: Secondary | ICD-10-CM | POA: Insufficient documentation

## 2019-11-27 DIAGNOSIS — R2231 Localized swelling, mass and lump, right upper limb: Secondary | ICD-10-CM | POA: Insufficient documentation

## 2019-11-27 DIAGNOSIS — S0101XA Laceration without foreign body of scalp, initial encounter: Secondary | ICD-10-CM | POA: Insufficient documentation

## 2019-11-27 DIAGNOSIS — Y999 Unspecified external cause status: Secondary | ICD-10-CM | POA: Insufficient documentation

## 2019-11-27 DIAGNOSIS — F17210 Nicotine dependence, cigarettes, uncomplicated: Secondary | ICD-10-CM | POA: Insufficient documentation

## 2019-11-27 DIAGNOSIS — S60221A Contusion of right hand, initial encounter: Secondary | ICD-10-CM

## 2019-11-27 DIAGNOSIS — Y9389 Activity, other specified: Secondary | ICD-10-CM | POA: Insufficient documentation

## 2019-11-27 DIAGNOSIS — S0003XA Contusion of scalp, initial encounter: Secondary | ICD-10-CM
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
## 2019-11-27 NOTE — Discharge Instructions (Signed)
Get help right away if: You have severe pain. Your hand or fingers become numb. Your hand or fingers turn pale, blue, or cold. You cannot move your hand or wrist. Your hand is warm to the touch. 
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
## 2019-12-14 ENCOUNTER — Other Ambulatory Visit (HOSPITAL_COMMUNITY)
Admission: RE | Admit: 2019-12-14 | Discharge: 2019-12-14 | Disposition: A | Discharge status: Home / Self Care | Payer: Medicaid Other | Source: Ambulatory Visit | Attending: Obstetrics & Gynecology | Admitting: Obstetrics & Gynecology

## 2019-12-14 ENCOUNTER — Encounter: Payer: Self-pay | Admitting: *Deleted

## 2019-12-14 ENCOUNTER — Other Ambulatory Visit (INDEPENDENT_AMBULATORY_CARE_PROVIDER_SITE_OTHER): Payer: Self-pay | Admitting: *Deleted

## 2019-12-14 DIAGNOSIS — Z113 Encounter for screening for infections with a predominantly sexual mode of transmission: Secondary | ICD-10-CM | POA: Insufficient documentation

## 2019-12-14 NOTE — Progress Notes (Signed)
Chart reviewed for nurse visit. Agree with plan of care.  Adline Potter, NP 12/14/2019 12:12 PM

## 2019-12-14 NOTE — Progress Notes (Signed)
   NURSE VISIT- VAGINITIS/STD/POC  SUBJECTIVE:  Valerie Greene is a 24 y.o. G2P1011 GYN patientfemale here for a vaginal swab for STD screen.  She reports the following symptoms: none  Denies abnormal vaginal bleeding, significant pelvic pain, fever, or UTI symptoms.  OBJECTIVE:  There were no vitals taken for this visit.  Appears well, in no apparent distress  ASSESSMENT: Vaginal swab for STD screen  PLAN: Self-collected vaginal probe for Gonorrhea, Chlamydia, Trichomonas sent to lab Treatment: to be determined once results are received Follow-up as needed if symptoms persist/worsen, or new symptoms develop  Nance Pear  12/14/2019 9:31 AM

## 2019-12-15 LAB — CERVICOVAGINAL ANCILLARY ONLY
Chlamydia: NEGATIVE
Comment: NEGATIVE
Comment: NEGATIVE
Comment: NORMAL
Neisseria Gonorrhea: NEGATIVE
Trichomonas: NEGATIVE

## 2020-01-12 ENCOUNTER — Other Ambulatory Visit: Payer: Medicaid Other

## 2020-04-26 ENCOUNTER — Other Ambulatory Visit (INDEPENDENT_AMBULATORY_CARE_PROVIDER_SITE_OTHER): Payer: Medicaid Other | Admitting: *Deleted

## 2020-04-26 ENCOUNTER — Other Ambulatory Visit (HOSPITAL_COMMUNITY)
Admission: RE | Admit: 2020-04-26 | Discharge: 2020-04-26 | Disposition: A | Discharge status: Home / Self Care | Payer: Medicaid Other | Source: Ambulatory Visit | Attending: Obstetrics & Gynecology | Admitting: Obstetrics & Gynecology

## 2020-04-26 ENCOUNTER — Other Ambulatory Visit: Payer: Self-pay

## 2020-04-26 DIAGNOSIS — Z3202 Encounter for pregnancy test, result negative: Secondary | ICD-10-CM

## 2020-04-26 DIAGNOSIS — N898 Other specified noninflammatory disorders of vagina: Secondary | ICD-10-CM

## 2020-04-26 LAB — POCT URINE PREGNANCY: Preg Test, Ur: NEGATIVE

## 2020-04-26 NOTE — Progress Notes (Addendum)
   NURSE VISIT- STD  SUBJECTIVE:  Valerie Greene is a 25 y.o. G2P1011 GYN patientfemale here for a vaginal swab for STD screen.  She reports the following symptoms: vaginal discharge for 1 week. Denies abnormal vaginal bleeding, significant pelvic pain, fever, or UTI symptoms. Pt also requested a pregnancy test and it was negative.   OBJECTIVE:  There were no vitals taken for this visit.  Appears well, in no apparent distress  ASSESSMENT: Vaginal swab for STD screen  PLAN: Self-collected vaginal probe for Gonorrhea, Chlamydia, Trichomonas, Bacterial Vaginosis, Yeast sent to lab Treatment: to be determined once results are received Follow-up as needed if symptoms persist/worsen, or new symptoms develop  Malachy Mood  04/26/2020 3:36 PM    Chart reviewed for nurse visit. Agree with plan of care.  Jacklyn Shell, [place] 04/26/2020 3:49 PM

## 2020-04-30 LAB — CERVICOVAGINAL ANCILLARY ONLY
Bacterial Vaginitis (gardnerella): POSITIVE — AB
Candida Glabrata: NEGATIVE
Candida Vaginitis: POSITIVE — AB
Chlamydia: NEGATIVE
Comment: NEGATIVE
Comment: NEGATIVE
Comment: NEGATIVE
Comment: NEGATIVE
Comment: NEGATIVE
Comment: NORMAL
Neisseria Gonorrhea: NEGATIVE
Trichomonas: NEGATIVE

## 2020-05-03 ENCOUNTER — Other Ambulatory Visit: Payer: Self-pay | Admitting: Advanced Practice Midwife

## 2020-05-03 MED ORDER — METRONIDAZOLE 500 MG PO TABS: 500.0000 mg | ORAL_TABLET | Freq: Two times a day (BID) | ORAL | 0 refills | Status: DC

## 2020-05-03 MED ORDER — FLUCONAZOLE 150 MG PO TABS: ORAL_TABLET | ORAL | 2 refills | Status: DC

## 2020-05-03 NOTE — Progress Notes (Signed)
Flagyl and diflucan for + swab

## 2020-07-11 ENCOUNTER — Other Ambulatory Visit: Payer: Medicaid Other

## 2020-07-18 ENCOUNTER — Other Ambulatory Visit (HOSPITAL_COMMUNITY)
Admission: RE | Admit: 2020-07-18 | Discharge: 2020-07-18 | Disposition: A | Discharge status: Home / Self Care | Payer: Medicaid Other | Source: Ambulatory Visit | Attending: Obstetrics & Gynecology | Admitting: Obstetrics & Gynecology

## 2020-07-18 ENCOUNTER — Other Ambulatory Visit (INDEPENDENT_AMBULATORY_CARE_PROVIDER_SITE_OTHER): Payer: Medicaid Other | Admitting: *Deleted

## 2020-07-18 ENCOUNTER — Other Ambulatory Visit: Payer: Self-pay

## 2020-07-18 DIAGNOSIS — N898 Other specified noninflammatory disorders of vagina: Secondary | ICD-10-CM

## 2020-07-18 NOTE — Progress Notes (Signed)
Chart reviewed for nurse visit. Agree with plan of care.  Adline Potter, NP 07/18/2020 4:18 PM

## 2020-07-18 NOTE — Progress Notes (Signed)
   NURSE VISIT- VAGINITIS/STD  SUBJECTIVE:  Valerie Greene is a 25 y.o. G2P1011 GYN patientfemale here for a vaginal swab for vaginitis screening, STD screen.  She reports the following symptoms: vaginal odor and vaginal discharge for 1-2 weeks. Denies abnormal vaginal bleeding, significant pelvic pain, fever, or UTI symptoms.  OBJECTIVE:  There were no vitals taken for this visit.  Appears well, in no apparent distress  ASSESSMENT: Vaginal swab for vaginitis screening & STD screen.  PLAN: Self-collected vaginal probe for Gonorrhea, Chlamydia, Trichomonas, Bacterial Vaginosis, Yeast sent to lab Treatment: to be determined once results are received Follow-up as needed if symptoms persist/worsen, or new symptoms develop  Malachy Mood  07/18/2020 4:08 PM

## 2020-07-20 LAB — CERVICOVAGINAL ANCILLARY ONLY
Bacterial Vaginitis (gardnerella): POSITIVE — AB
Candida Glabrata: NEGATIVE
Candida Vaginitis: NEGATIVE
Chlamydia: POSITIVE — AB
Comment: NEGATIVE
Comment: NEGATIVE
Comment: NEGATIVE
Comment: NEGATIVE
Comment: NEGATIVE
Comment: NORMAL
Neisseria Gonorrhea: NEGATIVE
Trichomonas: NEGATIVE

## 2020-07-23 ENCOUNTER — Telehealth: Payer: Self-pay | Admitting: Adult Health

## 2020-07-23 DIAGNOSIS — A749 Chlamydial infection, unspecified: Secondary | ICD-10-CM

## 2020-07-23 MED ORDER — METRONIDAZOLE 500 MG PO TABS: 500.0000 mg | ORAL_TABLET | Freq: Two times a day (BID) | ORAL | 0 refills | Status: DC

## 2020-07-23 MED ORDER — AZITHROMYCIN 500 MG PO TABS: ORAL_TABLET | ORAL | 0 refills | Status: DC

## 2020-07-23 NOTE — Telephone Encounter (Signed)
Pt aware that Vaginal swab was positive for BV and chlamydia, will rx flagyl 500 mg 1 bid x 7 days and Azithromycin 500 mg #2 2 po now and no sex and can do self swab for proof of cure in about 4 weeks will treat partner Valerie Greene [birth year]16[birth year] with azithromycin 500 mg #2  2 po now and no sex NCCDRC sent

## 2020-08-15 ENCOUNTER — Other Ambulatory Visit (INDEPENDENT_AMBULATORY_CARE_PROVIDER_SITE_OTHER): Payer: Medicaid Other

## 2020-08-15 ENCOUNTER — Other Ambulatory Visit: Payer: Self-pay

## 2020-08-15 ENCOUNTER — Other Ambulatory Visit (HOSPITAL_COMMUNITY)
Admission: RE | Admit: 2020-08-15 | Discharge: 2020-08-15 | Disposition: A | Discharge status: Home / Self Care | Payer: Medicaid Other | Source: Ambulatory Visit | Attending: Obstetrics & Gynecology | Admitting: Obstetrics & Gynecology

## 2020-08-15 DIAGNOSIS — N898 Other specified noninflammatory disorders of vagina: Secondary | ICD-10-CM | POA: Diagnosis not present

## 2020-08-15 NOTE — Progress Notes (Signed)
Agree with A & P. 

## 2020-08-15 NOTE — Progress Notes (Signed)
   NURSE VISIT- VAGINITIS/STD/POC  SUBJECTIVE:  Valerie Greene is a 25 y.o. G2P1011 GYN patientfemale here for a vaginal swab for vaginitis screening, STD screen.  She reports the following symptoms: odor for 2 weeks. Denies abnormal vaginal bleeding, significant pelvic pain, fever, or UTI symptoms.  OBJECTIVE:  There were no vitals taken for this visit.  Appears well, in no apparent distress  ASSESSMENT: Vaginal swab for vaginitis & STD screening  PLAN: Self-collected vaginal probe for Gonorrhea, Chlamydia, Trichomonas, Bacterial Vaginosis, Yeast sent to lab Treatment: to be determined once results are received Follow-up as needed if symptoms persist/worsen, or new symptoms develop  Argusta Mcgann A Quinnten Calvin  08/15/2020 9:50 AM

## 2020-08-16 LAB — CERVICOVAGINAL ANCILLARY ONLY
Bacterial Vaginitis (gardnerella): POSITIVE — AB
Candida Glabrata: NEGATIVE
Candida Vaginitis: NEGATIVE
Chlamydia: POSITIVE — AB
Comment: NEGATIVE
Comment: NEGATIVE
Comment: NEGATIVE
Comment: NEGATIVE
Comment: NEGATIVE
Comment: NORMAL
Neisseria Gonorrhea: NEGATIVE
Trichomonas: NEGATIVE

## 2020-08-17 ENCOUNTER — Telehealth: Payer: Self-pay | Admitting: Adult Health

## 2020-08-17 MED ORDER — METRONIDAZOLE 500 MG PO TABS: 500.0000 mg | ORAL_TABLET | Freq: Two times a day (BID) | ORAL | 0 refills | Status: AC

## 2020-08-17 MED ORDER — DOXYCYCLINE HYCLATE 100 MG PO CAPS: 100.0000 mg | ORAL_CAPSULE | Freq: Two times a day (BID) | ORAL | 0 refills | Status: AC

## 2020-08-17 NOTE — Addendum Note (Signed)
Addended by: Leilani Able, Ata Pecha A on: 08/17/2020 11:43 AM   Modules accepted: Orders

## 2020-08-17 NOTE — Telephone Encounter (Signed)
Patient called stating that she got her lab results and she would like to know if we could send something to her pharmacy which is [facility] in [place]. Please contact pt when medication has been called in

## 2020-09-17 ENCOUNTER — Other Ambulatory Visit: Payer: Medicaid Other | Admitting: Women's Health

## 2020-12-07 ENCOUNTER — Ambulatory Visit
Admission: EM | Admit: 2020-12-07 | Discharge: 2020-12-07 | Disposition: A | Discharge status: Home / Self Care | Payer: Medicaid Other | Attending: Emergency Medicine | Admitting: Emergency Medicine

## 2020-12-07 ENCOUNTER — Encounter: Payer: Self-pay | Admitting: Emergency Medicine

## 2020-12-07 DIAGNOSIS — L300 Nummular dermatitis: Secondary | ICD-10-CM

## 2020-12-07 MED ORDER — TRIAMCINOLONE ACETONIDE 0.1 % EX CREA: 1.0000 "application " | TOPICAL_CREAM | Freq: Two times a day (BID) | CUTANEOUS | 0 refills | Status: DC

## 2020-12-07 NOTE — ED Provider Notes (Signed)
[facility]ARE [facility]ENTER   [id number] 12/07/20 Arrival Time: 1530  CC: rash  SUBJECTIVE:  Valerie Greene is a 25 y.o. female who presents with a rash x 1-2 weeks.  Denies precipitating event or trauma.  Denies changes in soaps, detergents, close contacts with similar rash, known trigger or environmental trigger, allergy. Denies medications change or starting a new medication recently.  Does admit to tanning bed use.  Localizes the rash to LT leg, torso, neck.  Denies pain or itching.  Denies alleviating or aggravating.  Denies similar symptoms in the past.  Denies fever, chills, nausea, vomiting, erythema, swelling, discharge.    ROS: As per HPI.  All other pertinent ROS negative.     Past Medical History:  Diagnosis Date   Chlamydia 06/11/2016   Kidney stone    Vaginal discharge 12/21/2013   History reviewed. No pertinent surgical history. No Known Allergies No current facility-administered medications on file prior to encounter.   No current outpatient medications on file prior to encounter.   Social History   Socioeconomic History   Marital status: Single    Spouse name: Not on file   Number of children: 1   Years of education: Not on file   Highest education level: Not on file  Occupational History   Not on file  Tobacco Use   Smoking status: Every Day    Packs/day: 1.00    Years: 6.00    Pack years: 6.00    Types: Cigarettes   Smokeless tobacco: Never  Vaping Use   Vaping Use: Never used  Substance and Sexual Activity   Alcohol use: Yes    Comment: sometimes   Drug use: No   Sexual activity: Yes    Birth control/protection: None  Other Topics Concern   Not on file  Social History Narrative   Not on file   Social Determinants of Health   Financial Resource Strain: Not on file  Food Insecurity: Not on file  Transportation Needs: Not on file  Physical Activity: Not on file  Stress: Not on file  Social Connections: Not on file  Intimate Partner Violence:  Not on file   Family History  Problem Relation Age of Onset   Cancer Maternal Grandmother        breast, lung   Cancer Maternal Grandfather        pancreatic    OBJECTIVE: Vitals:   12/07/20 1559  BP: 111/72  Pulse: 76  Resp: 16  Temp: 97.9 F (36.6 C)  TempSrc: Oral  SpO2: 98%    General appearance: alert; no distress Head: NCAT Lungs: normal respiratory effort Extremities: no edema Skin: warm and dry; pink scaling patches with some papules to RT thigh (appears in coin shape with slightly raised circumference), torso, and back of neck, NTTP, no obvious drainage or bleeding Psychological: alert and cooperative; normal mood and affect  ASSESSMENT & PLAN:  1. Nummular eczema     Meds ordered this encounter  Medications   triamcinolone cream (KENALOG) 0.1 %    Sig: Apply 1 application topically 2 (two) times daily.    Dispense:  30 g    Refill:  0    Order Specific Question:   Supervising Provider    Answer:   Eustace Moore [[id number]]    Prescribed triamcinolone for possible eczema. Follow up with PCP if symptoms persists Return or go to the ER if you have any new or worsening symptoms such as fever, chills, nausea, vomiting, redness, swelling,  discharge, if symptoms do not improve with medications, etc...  Reviewed expectations re: course of current medical issues. Questions answered. Outlined signs and symptoms indicating need for more acute intervention. Patient verbalized understanding. After Visit Summary given.    Rennis Harding, PA-C 12/07/20 1610

## 2020-12-07 NOTE — ED Triage Notes (Signed)
Rash x 2 weeks all over body.  States rash does not itch or hurt.

## 2020-12-07 NOTE — Discharge Instructions (Addendum)
Prescribed triamcinolone for possible eczema. Follow up with PCP if symptoms persists Return or go to the ER if you have any new or worsening symptoms such as fever, chills, nausea, vomiting, redness, swelling, discharge, if symptoms do not improve with medications, etc..Marland Kitchen

## 2020-12-19 ENCOUNTER — Other Ambulatory Visit: Payer: Medicaid Other | Admitting: Advanced Practice Midwife

## 2021-01-01 ENCOUNTER — Other Ambulatory Visit: Payer: Medicaid Other

## 2021-04-17 ENCOUNTER — Other Ambulatory Visit: Payer: Medicaid Other

## 2021-04-18 ENCOUNTER — Other Ambulatory Visit: Payer: Medicaid Other

## 2021-06-18 ENCOUNTER — Other Ambulatory Visit (INDEPENDENT_AMBULATORY_CARE_PROVIDER_SITE_OTHER): Payer: Medicaid Other

## 2021-06-18 ENCOUNTER — Other Ambulatory Visit: Payer: Self-pay

## 2021-06-18 ENCOUNTER — Other Ambulatory Visit (HOSPITAL_COMMUNITY)
Admission: RE | Admit: 2021-06-18 | Discharge: 2021-06-18 | Disposition: A | Discharge status: Home / Self Care | Payer: Medicaid Other | Source: Ambulatory Visit | Attending: Obstetrics & Gynecology | Admitting: Obstetrics & Gynecology

## 2021-06-18 DIAGNOSIS — Z113 Encounter for screening for infections with a predominantly sexual mode of transmission: Secondary | ICD-10-CM | POA: Insufficient documentation

## 2021-06-18 NOTE — Progress Notes (Signed)
? ?  NURSE VISIT- VAGINITIS/STD ? ?SUBJECTIVE:  ?Valerie Greene is a 26 y.o. G2P1011 GYN patientfemale here for a vaginal swab for STD screen.  She reports the following symptoms: none.  Had chlamydia a year ago and "really didn't take medication like I should have".  ?Denies abnormal vaginal bleeding, significant pelvic pain, fever, or UTI symptoms. ? ?OBJECTIVE:  ?There were no vitals taken for this visit.  ?Appears well, in no apparent distress ? ?ASSESSMENT: ?Vaginal swab for STD screen ? ?PLAN: ?Self-collected vaginal probe for Gonorrhea, Chlamydia, Trichomonas, Bacterial Vaginosis, Yeast sent to lab ?Treatment: to be determined once results are received ?Follow-up as needed if symptoms persist/worsen, or new symptoms develop ? ?Kristeen Miss Yareth Macdonnell  ?06/18/2021 ?3:22 PM ? ?

## 2021-06-20 ENCOUNTER — Other Ambulatory Visit: Payer: Self-pay | Admitting: Adult Health

## 2021-06-20 LAB — CERVICOVAGINAL ANCILLARY ONLY
Bacterial Vaginitis (gardnerella): POSITIVE — AB
Candida Glabrata: NEGATIVE
Candida Vaginitis: NEGATIVE
Chlamydia: NEGATIVE
Comment: NEGATIVE
Comment: NEGATIVE
Comment: NEGATIVE
Comment: NEGATIVE
Comment: NEGATIVE
Comment: NORMAL
Neisseria Gonorrhea: NEGATIVE
Trichomonas: NEGATIVE

## 2021-06-20 IMAGING — DX DG CHEST 2V
2 series · 2 of 2 positions shown · non-contrast
Comparison: 12/04/2017

CLINICAL DATA: Chest pain for 2 days on the left

EXAM:
CHEST - 2 VIEW

[chest pa]
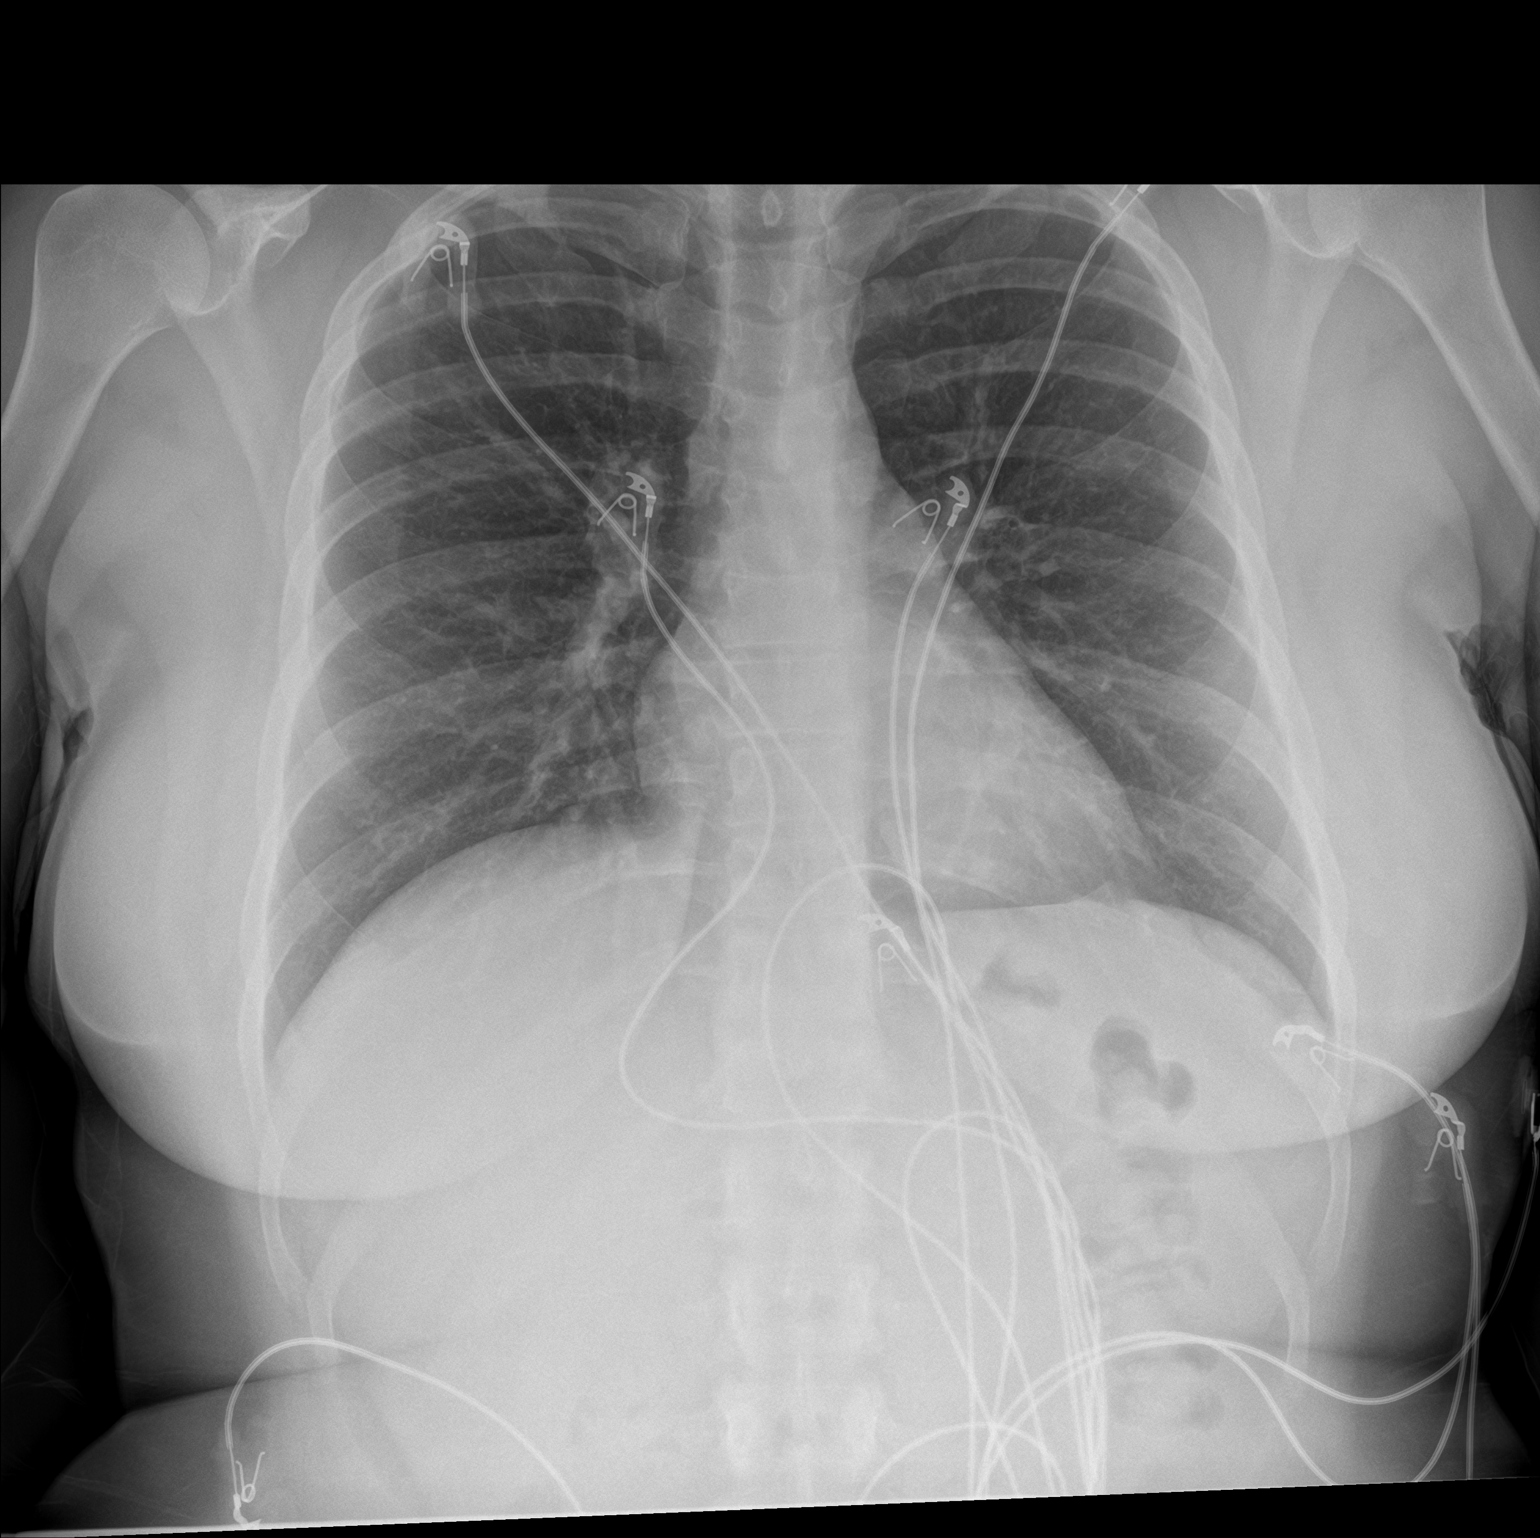

[chest lat]
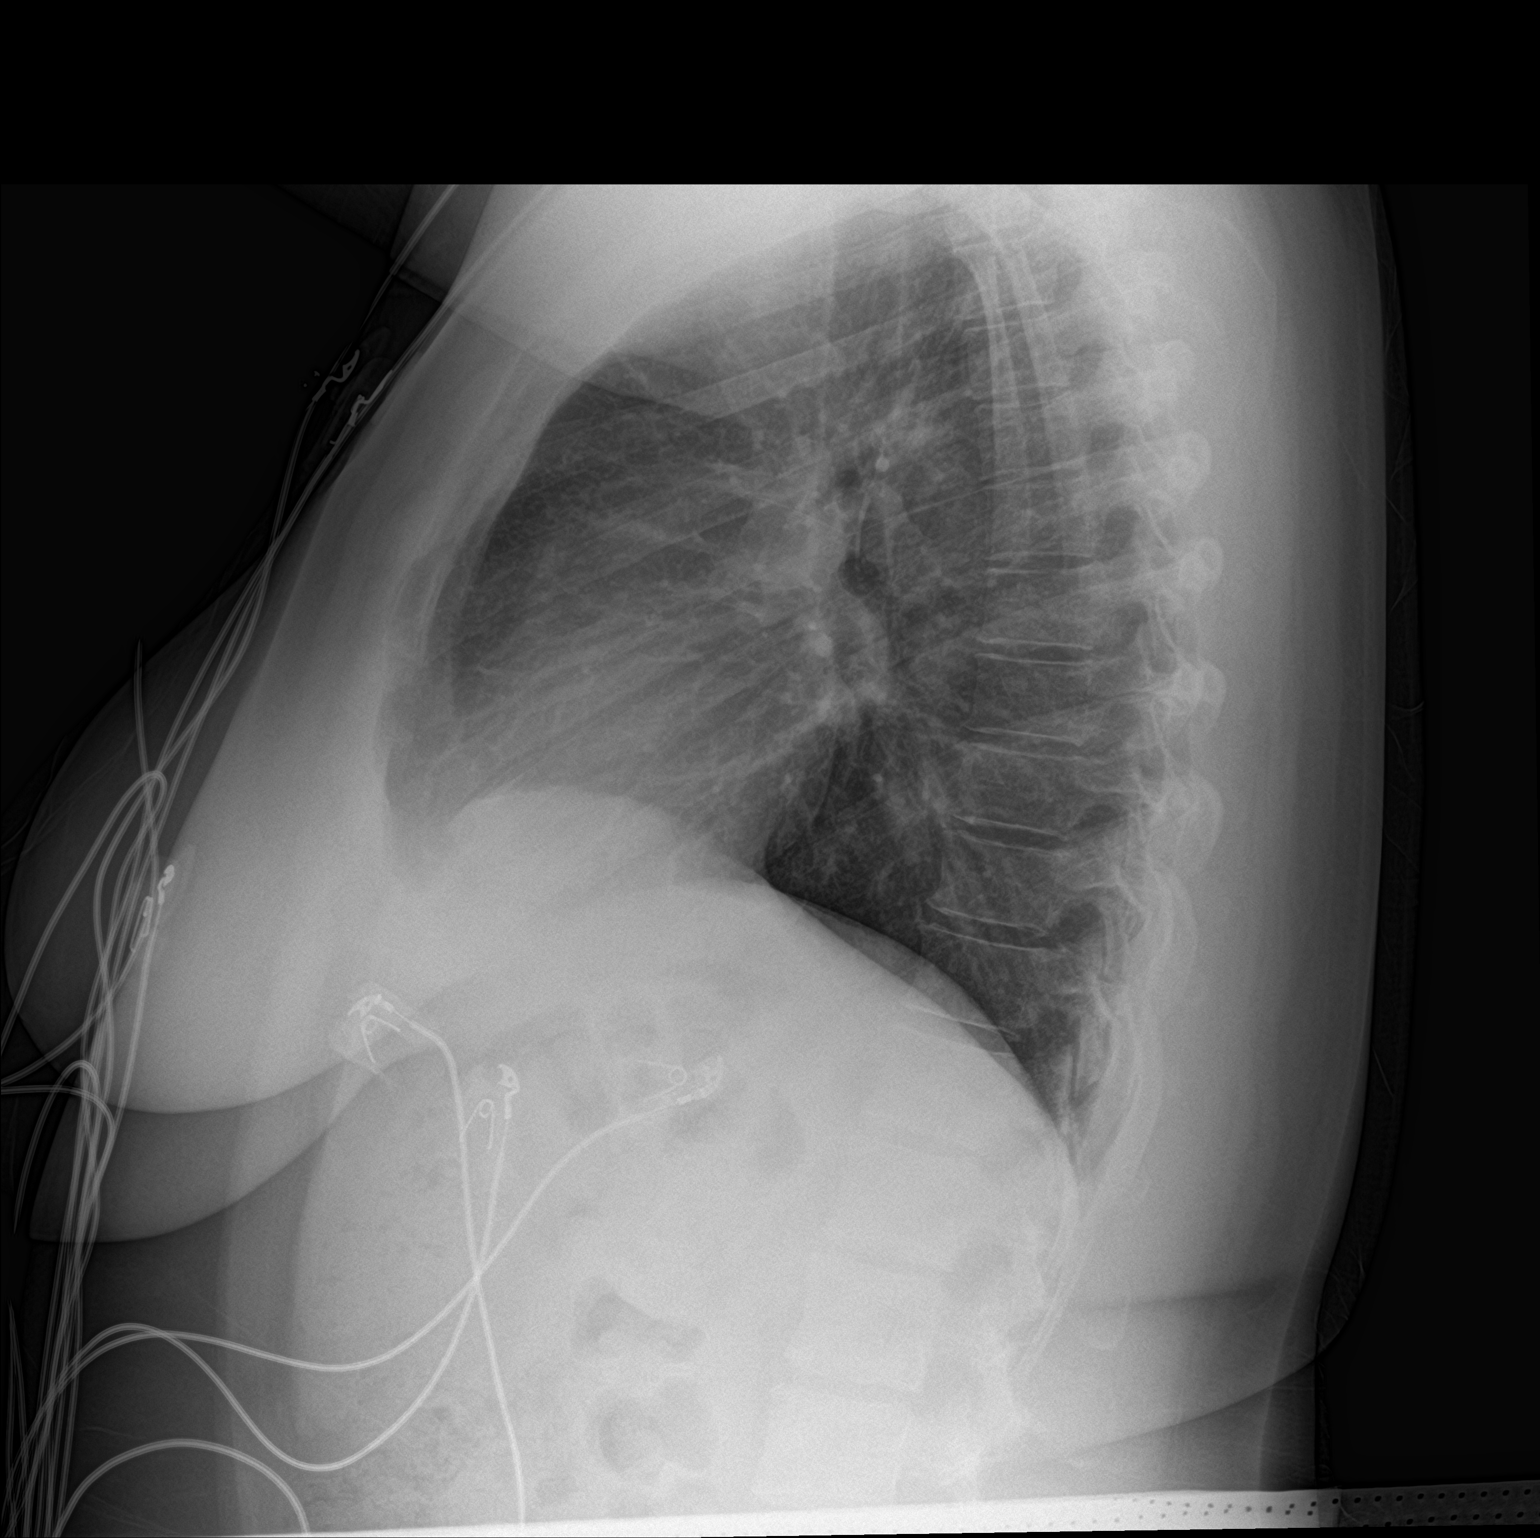

[2 of 2 positions shown; findings below may reference images not displayed]

FINDINGS: Cardiac shadow is within normal limits. The lungs are well aerated
bilaterally. No focal infiltrate or sizable effusion is seen. No
bony abnormality is noted.
IMPRESSION: No acute abnormality seen.

## 2021-06-20 MED ORDER — METRONIDAZOLE 500 MG PO TABS: 500.0000 mg | ORAL_TABLET | Freq: Two times a day (BID) | ORAL | 0 refills | Status: DC

## 2021-06-20 NOTE — Progress Notes (Signed)
+  BV on vaginal swab, will rx flagyl, no sex or alcohol during treatment  

## 2021-06-26 ENCOUNTER — Telehealth: Payer: Self-pay

## 2021-06-26 MED ORDER — FLUCONAZOLE 150 MG PO TABS: ORAL_TABLET | ORAL | 1 refills | Status: DC
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
## 2021-06-26 NOTE — Telephone Encounter (Signed)
Rx sent in for diflucan.   

## 2021-09-25 ENCOUNTER — Other Ambulatory Visit: Payer: Self-pay | Admitting: Adult Health

## 2021-09-25 MED ORDER — METRONIDAZOLE 500 MG PO TABS: 500.0000 mg | ORAL_TABLET | Freq: Two times a day (BID) | ORAL | 0 refills | Status: DC

## 2021-09-25 NOTE — Telephone Encounter (Signed)
Rx flagyl  

## 2021-12-09 ENCOUNTER — Ambulatory Visit: Payer: Medicaid Other | Admitting: Adult Health

## 2022-02-04 ENCOUNTER — Other Ambulatory Visit: Payer: Self-pay | Admitting: Adult Health

## 2022-02-04 MED ORDER — METRONIDAZOLE 500 MG PO TABS: 500.0000 mg | ORAL_TABLET | Freq: Two times a day (BID) | ORAL | 0 refills | Status: DC

## 2022-02-04 NOTE — Progress Notes (Signed)
Refill flagyl  

## 2022-03-31 NOTE — L&D Delivery Note (Cosign Needed Addendum)
OB/GYN Faculty Practice Delivery Note  Valerie Greene is a 27 y.o. G3P1011 s/p SVD at [redacted]w[redacted]d. She was admitted for IOL 2/2 polyhydramnios.   ROM: 8h 32m with clear fluid GBS Status: Negative/-- (09/17 0156) Maximum Maternal Temperature: 98.59F Called to room and patient was complete and pushing. Infant restituted from direct OA to ROP. Loose nuchal cord x2 reduced with ease at perineum. Anterior (right) axilla was grasped and delivered with ease and remainder of body delivered with single maternal effort. Delayed cord clamping x 1 minute. Cord clamped x 2 and cut by father of baby.  Placenta was intact. A 3 vessel cord was noted. Fundus firm with fundal rub and Pitocin.  Baby Weight: 8lb5oz  Placenta: Sent to L&D Complications: None Lacerations: First degree laceration - repaired EBL: 259 mL Analgesia: Epidural   Infant:  APGAR (1 MIN): 7  APGAR (5 MINS): 9  APGAR (10 MINS):     Mom to postpartum.   Baby to Couplet care / Skin to Skin.  Charma Igo, MD 01/03/2023 11:37 PM  Attestation of Supervision of Resident:  I confirm that I have verified the information documented in the resident's note and that I was present and gloved for the duration of the delivery.  I have verified that all services and findings are accurately documented in this resident's note; and I agree with management and plan as outlined in the documentation. I have also made any necessary editorial changes.  Sundra Aland, MD OB Fellow, Faculty Practice [facility], [facility]

## 2022-03-31 NOTE — L&D Delivery Note (Incomplete)
Delivery Note Called to room and patient was complete and pushing. Infant restituted from direct OA to ROP. Loose nuchal cord x2 reduced with ease at perineum. Anterior (right) axilla was grasped and   . APGAR: 7, 9; weight 8lb5oz. Baby placed skin-to-skin w/ mom. Delayed cord clamping x 1 minute. Cord clamped x 2 and cut by father of baby.  Placenta was intact. A 3 vessel cord was noted. Fundus firm with fundal rub and Pitocin. Anesthesia: Epidural Episiotomy: None Lacerations: 1st degree Suture Repair: 3.0 monocryl Est. Blood Loss (mL): 259  Mom to postpartum.   Baby to Couplet care / Skin to Skin.  Charma Igo, MD 01/03/2023 11:37 PM  OB/GYN Faculty Practice Delivery Note  Valerie Greene is a 27 y.o. G3P1011 s/p *** at [redacted]w[redacted]d. She was admitted for ***.   ROM: 8h 16m with *** fluid GBS Status: *** Negative/-- (09/17 0156) Maximum Maternal Temperature:    Labor Progress: Initial SVE: ***. She then progressed to complete.   Delivery Date/Time: *** Delivery:  Head delivered ***. No nuchal cord present***. Shoulder and body delivered in usual fashion. Infant with spontaneous cry, placed on mother's abdomen, dried and stimulated. Cord clamped x 2 after 1-minute delay, and cut by ***. Cord blood drawn. Placenta delivered spontaneously with gentle cord traction. Fundus firm with massage and Pitocin. Labia, perineum, vagina, and cervix inspected inspected with ***.  Baby Weight: pending  Placenta: Sent to L&D*** Complications: None*** Lacerations: *** EBL: *** mL Analgesia: Epidural***   Infant:  APGAR (1 MIN): 7  APGAR (5 MINS): 9  APGAR (10 MINS):

## 2022-04-30 ENCOUNTER — Ambulatory Visit (INDEPENDENT_AMBULATORY_CARE_PROVIDER_SITE_OTHER): Payer: Self-pay | Admitting: *Deleted

## 2022-04-30 VITALS — BP 138/83 | HR 112 | Ht 60.0 in | Wt 212.0 lb

## 2022-04-30 DIAGNOSIS — Z3201 Encounter for pregnancy test, result positive: Secondary | ICD-10-CM

## 2022-04-30 DIAGNOSIS — N926 Irregular menstruation, unspecified: Secondary | ICD-10-CM

## 2022-04-30 LAB — POCT URINE PREGNANCY: Preg Test, Ur: POSITIVE — AB

## 2022-04-30 NOTE — Progress Notes (Signed)
   NURSE VISIT- PREGNANCY CONFIRMATION   SUBJECTIVE:  ROMESHA SCHERER is a 27 y.o. G50P1011 female at [redacted]w[redacted]d by certain LMP of Patient's last menstrual period was 04/04/2022. Here for pregnancy confirmation.  Home pregnancy test: positive x 5   She reports no complaints.  She is not taking prenatal vitamins.    OBJECTIVE:  BP 138/83 (BP Location: Right Arm, Patient Position: Sitting, Cuff Size: Normal)   Pulse (!) 112   Ht 5' (1.524 m)   Wt 212 lb (96.2 kg)   LMP 04/04/2022   BMI 41.40 kg/m   Appears well, in no apparent distress  Results for orders placed or performed in visit on 04/30/22 (from the past 24 hour(s))  POCT urine pregnancy   Collection Time: 04/30/22 11:52 AM  Result Value Ref Range   Preg Test, Ur Positive (A) Negative    ASSESSMENT: Positive pregnancy test, [redacted]w[redacted]d by LMP    PLAN: Schedule for dating ultrasound in 4 weeks Prenatal vitamins: plans to begin OTC ASAP   Nausea medicines: not currently needed   OB packet given: Yes  Alice Rieger  04/30/2022 11:54 AM

## 2022-05-26 ENCOUNTER — Other Ambulatory Visit: Payer: Self-pay | Admitting: Obstetrics & Gynecology

## 2022-05-26 DIAGNOSIS — O3680X0 Pregnancy with inconclusive fetal viability, not applicable or unspecified: Secondary | ICD-10-CM

## 2022-05-27 ENCOUNTER — Ambulatory Visit: Payer: Medicaid Other

## 2022-05-27 DIAGNOSIS — O3680X0 Pregnancy with inconclusive fetal viability, not applicable or unspecified: Secondary | ICD-10-CM | POA: Diagnosis not present

## 2022-05-27 DIAGNOSIS — Z3A01 Less than 8 weeks gestation of pregnancy: Secondary | ICD-10-CM

## 2022-05-27 NOTE — Progress Notes (Signed)
[place] 7+4 wks,single IUP with yolk sac,FHR 157 bpm,normal ovaries,CRL 12.76 mm

## 2022-06-10 ENCOUNTER — Encounter: Payer: Self-pay | Admitting: Women's Health

## 2022-06-13 ENCOUNTER — Encounter: Payer: Self-pay | Admitting: Women's Health

## 2022-07-07 ENCOUNTER — Ambulatory Visit (INDEPENDENT_AMBULATORY_CARE_PROVIDER_SITE_OTHER): Payer: Medicaid Other

## 2022-07-07 ENCOUNTER — Encounter: Payer: Self-pay | Admitting: Women's Health

## 2022-07-07 ENCOUNTER — Ambulatory Visit (INDEPENDENT_AMBULATORY_CARE_PROVIDER_SITE_OTHER): Payer: Medicaid Other | Admitting: Women's Health

## 2022-07-07 ENCOUNTER — Other Ambulatory Visit: Payer: Self-pay | Admitting: Obstetrics & Gynecology

## 2022-07-07 ENCOUNTER — Encounter: Payer: Medicaid Other | Admitting: *Deleted

## 2022-07-07 VITALS — BP 110/67 | HR 90 | Wt 215.8 lb

## 2022-07-07 DIAGNOSIS — Z1332 Encounter for screening for maternal depression: Secondary | ICD-10-CM

## 2022-07-07 DIAGNOSIS — O09899 Supervision of other high risk pregnancies, unspecified trimester: Secondary | ICD-10-CM | POA: Insufficient documentation

## 2022-07-07 DIAGNOSIS — Z3682 Encounter for antenatal screening for nuchal translucency: Secondary | ICD-10-CM | POA: Diagnosis not present

## 2022-07-07 DIAGNOSIS — Z3481 Encounter for supervision of other normal pregnancy, first trimester: Secondary | ICD-10-CM | POA: Diagnosis not present

## 2022-07-07 DIAGNOSIS — Z3480 Encounter for supervision of other normal pregnancy, unspecified trimester: Secondary | ICD-10-CM

## 2022-07-07 DIAGNOSIS — Z3A13 13 weeks gestation of pregnancy: Secondary | ICD-10-CM

## 2022-07-07 DIAGNOSIS — R8271 Bacteriuria: Secondary | ICD-10-CM

## 2022-07-07 DIAGNOSIS — Z1379 Encounter for other screening for genetic and chromosomal anomalies: Secondary | ICD-10-CM

## 2022-07-07 DIAGNOSIS — F17200 Nicotine dependence, unspecified, uncomplicated: Secondary | ICD-10-CM

## 2022-07-07 DIAGNOSIS — Z113 Encounter for screening for infections with a predominantly sexual mode of transmission: Secondary | ICD-10-CM

## 2022-07-07 MED ORDER — BLOOD PRESSURE CUFF MISC: 1.0000 | 0 refills | Status: DC

## 2022-07-07 MED ORDER — ASPIRIN 81 MG PO TBEC
81.0000 mg | DELAYED_RELEASE_TABLET | Freq: Every day | ORAL | 3 refills | Status: DC
Start: 2022-07-07 — End: 2023-01-05

## 2022-07-07 NOTE — Progress Notes (Signed)
INITIAL OBSTETRICAL VISIT Patient name: Valerie Greene MRN [id number]  Date of birth: 1995-04-18 Chief Complaint:   Initial Prenatal Visit  History of Present Illness:   Valerie Greene is a 27 y.o. G72P1011 Caucasian female at [redacted]w[redacted]d by LMP c/w u/s at 7 weeks with an Estimated Date of Delivery: 01/09/23 being seen today for her initial obstetrical visit.   Patient's last menstrual period was 04/04/2022. Her obstetrical history is significant for  term uncomplicated SVB x 1, EAB x 1 .   Today she reports no complaints.  Smokes 1ppd, quit for a week and started back. Declines Quitline[place], wants to quit on her own Last pap 07/22/19. Results were: NILM w/ HRHPV negative     07/07/2022   10:02 AM 07/22/2019   11:26 AM 09/09/2017    2:16 PM 06/04/2016    9:10 AM  Depression screen PHQ 2/9  Decreased Interest 0 0 0 0  Down, Depressed, Hopeless 0 0 0 0  PHQ - 2 Score 0 0 0 0  Altered sleeping 0 0    Tired, decreased energy 3 0    Change in appetite 0 0    Feeling bad or failure about yourself  0 0    Trouble concentrating 0 0    Moving slowly or fidgety/restless 0 0    Suicidal thoughts 0 0    PHQ-9 Score 3 0          07/07/2022   10:02 AM 07/22/2019   11:26 AM  GAD 7 : Generalized Anxiety Score  Nervous, Anxious, on Edge 0 0  Control/stop worrying 0 0  Worry too much - different things 0 0  Trouble relaxing 0 0  Restless 0 0  Easily annoyed or irritable 0 0  Afraid - awful might happen 0 0  Total GAD 7 Score 0 0     Review of Systems:   Pertinent items are noted in HPI Denies cramping/contractions, leakage of fluid, vaginal bleeding, abnormal vaginal discharge w/ itching/odor/irritation, headaches, visual changes, shortness of breath, chest pain, abdominal pain, severe nausea/vomiting, or problems with urination or bowel movements unless otherwise stated above.  Pertinent History Reviewed:  Reviewed past medical,surgical, social, obstetrical and family history.  Reviewed  problem list, medications and allergies. OB History  Gravida Para Term Preterm AB Living  3 1 1   1 1   SAB IAB Ectopic Multiple Live Births    1     1    # Outcome Date GA Lbr Len/2nd Weight Sex Delivery Anes PTL Lv  3 Current           2 IAB 03/2017          1 Term 07/31/10 [redacted]w[redacted]d  7 lb 10 oz (3.459 kg) F Vag-Spont EPI  LIV   Physical Assessment:   Vitals:   07/07/22 1001  BP: 110/67  Pulse: 90  Weight: 215 lb 12.8 oz (97.9 kg)  Body mass index is 42.15 kg/m.       Physical Examination:  General appearance - well appearing, and in no distress  Mental status - alert, oriented to person, place, and time  Psych:  She has a normal mood and affect  Skin - warm and dry, normal color, no suspicious lesions noted  Chest - effort normal, all lung fields clear to auscultation bilaterally  Heart - normal rate and regular rhythm  Abdomen - soft, nontender  Extremities:  No swelling or varicosities noted  Thin prep pap is  not done   Chaperone: N/A    TODAY'S NT [place] 13+3 wks,measurements c/w dates,FHR 147 bpm,anterior placenta,normal ovaries,NT 1.8 mm,NB present,CRL 81.1 mm   No results found for this or any previous visit (from the past 24 hour(s)).  Assessment & Plan:  1) Low-Risk Pregnancy G3P1011 at [redacted]w[redacted]d with an Estimated Date of Delivery: 01/09/23   2) Initial OB visit  3) Smoker> Smokes 1pp/day, counseled x 3-40mins, advised cessation, discussed risks to fetus while pregnant, to infant pp, and to herself. Offered Quitline[place], declined, wants to quit on her own    Meds:  Meds ordered this encounter  Medications   Blood Pressure Monitoring (BLOOD PRESSURE CUFF) MISC    Sig: 1 Device by Does not apply route as directed.    Dispense:  1 each    Refill:  0   aspirin EC 81 MG tablet    Sig: Take 1 tablet (81 mg total) by mouth daily. Swallow whole.    Dispense:  90 tablet    Refill:  3    Initial labs obtained Continue prenatal vitamins Reviewed n/v relief measures and  warning s/s to report Reviewed recommended weight gain based on pre-gravid BMI Encouraged well-balanced diet Genetic & carrier screening discussed: requests Panorama, NT/IT, and Horizon  Ultrasound discussed; fetal survey: requested CCNC completed> form faxed if has or is planning to apply for medicaid The nature of [employer] for [employer] with multiple MDs and other Advanced Practice Providers was explained to patient; also emphasized that fellows, residents, and students are part of our team. Does not have home bp cuff. Office bp cuff given: no. Rx sent: yes. Check bp weekly, let us know if consistently >140/90.   Indications for ASA therapy (per uptodate) OR Two or more of the following: Obesity (BMI>30 kg/m2) Yes Personal risk factors (eg, previous pregnancy w/ LBW or SGA, previous adverse pregnancy outcome [eg, stillbirth], interval >10 years between pregnancies) Yes  Follow-up: Return in about 3 weeks (around 07/28/2022) for LROB, 2nd IT, CNM, in person; then 7wks for anatomy u/s, LROB w/ CNM.   Orders Placed This Encounter  Procedures   GC/Chlamydia Probe Amp   Urine Culture   CBC/D/Plt+RPR+Rh+ABO+RubIgG...   Integrated 1   Panorama Prenatal Test Full Panel   HORIZON CUSTOM   HgB A1c    Cheral Marker CNM, [facility] 07/07/2022 10:52 AM

## 2022-07-07 NOTE — Patient Instructions (Signed)
[place], thank you for choosing our office today! We appreciate the opportunity to meet your healthcare needs. You may receive a short survey by mail, e-mail, or through MyChart. If you are happy with your care we would appreciate if you could take just a few minutes to complete the survey questions. We read all of your comments and take your feedback very seriously. Thank you again for choosing our office.  °[facility] ° [facility] [place] °([address] , [place] [address]) °Entrance C, located off of [address] °Free 24/7 valet parking  ° Nausea & Vomiting °Have saltine crackers or pretzels by your bed and eat a few bites before you raise your head out of bed in the morning °Eat small frequent meals throughout the day instead of large meals °Drink plenty of fluids throughout the day to stay hydrated, just don't drink a lot of fluids with your meals.  This can make your stomach fill up faster making you feel sick °Do not brush your teeth right after you eat °Products with real ginger are good for nausea, like ginger ale and ginger hard candy Make sure it says made with real ginger! °Sucking on sour candy like lemon heads is also good for nausea °If your prenatal vitamins make you nauseated, take them at night so you will sleep through the nausea °Sea Bands °If you feel like you need medicine for the nausea & vomiting please let us know °If you are unable to keep any fluids or food down please let us know ° ° Constipation °Drink plenty of fluid, preferably water, throughout the day °Eat foods high in fiber such as fruits, vegetables, and grains °Exercise, such as walking, is a good way to keep your bowels regular °Drink warm fluids, especially warm prune juice, or decaf coffee °Eat a 1/2 cup of real oatmeal (not instant), 1/2 cup applesauce, and 1/2-1 cup warm prune juice every day °If needed, you may take Colace (docusate sodium) stool softener  once or twice a day to help keep the stool soft.  °If you still are having problems with constipation, you may take Miralax once daily as needed to help keep your bowels regular.  ° °Home Blood Pressure Monitoring for Patients  ° °Your provider has recommended that you check your blood pressure (BP) at least once a week at home. If you do not have a blood pressure cuff at home, one will be provided for you. Contact your provider if you have not received your monitor within 1 week.  ° °Helpful Tips for Accurate Home Blood Pressure Checks  °Don't smoke, exercise, or drink caffeine 30 minutes before checking your BP °Use the restroom before checking your BP (a full bladder can raise your pressure) °Relax in a comfortable upright chair °Feet on the ground °Left arm resting comfortably on a flat surface at the level of your heart °Legs uncrossed °Back supported °Sit quietly and don't talk °Place the cuff on your bare arm °Adjust snuggly, so that only two fingertips can fit between your skin and the top of the cuff °Check 2 readings separated by at least one minute °Keep a log of your BP readings °For a visual, please reference this diagram: http://ccnc.care/bpdiagram ° °Provider Name: [facility]     Phone: [phone] ° °Zone 1: ALL CLEAR  °Continue to monitor your symptoms:  °BP reading is less than 140 (top number) or less than 90 (bottom   number)  No right upper stomach pain No headaches or seeing spots No feeling nauseated or throwing up No swelling in face and hands  Zone 2: CAUTION Call your doctor's office for any of the following:  BP reading is greater than 140 (top number) or greater than 90 (bottom number)  Stomach pain under your ribs in the middle or right side Headaches or seeing spots Feeling nauseated or throwing up Swelling in face and hands  Zone 3: EMERGENCY  Seek immediate medical care if you have any of the following:  BP reading is greater than160 (top number) or greater than  110 (bottom number) Severe headaches not improving with Tylenol Serious difficulty catching your breath Any worsening symptoms from Zone 2    First Trimester of Pregnancy The first trimester of pregnancy is from week 1 until the end of week 12 (months 1 through 3). A week after a sperm fertilizes an egg, the egg will implant on the wall of the uterus. This embryo will begin to develop into a baby. Genes from you and your partner are forming the baby. The female genes determine whether the baby is a boy or a girl. At 6-8 weeks, the eyes and face are formed, and the heartbeat can be seen on ultrasound. At the end of 12 weeks, all the baby's organs are formed.  Now that you are pregnant, you will want to do everything you can to have a healthy baby. Two of the most important things are to get good prenatal care and to follow your health care provider's instructions. Prenatal care is all the medical care you receive before the baby's birth. This care will help prevent, find, and treat any problems during the pregnancy and childbirth. BODY CHANGES Your body goes through many changes during pregnancy. The changes vary from woman to woman.  You may gain or lose a couple of pounds at first. You may feel sick to your stomach (nauseous) and throw up (vomit). If the vomiting is uncontrollable, call your health care provider. You may tire easily. You may develop headaches that can be relieved by medicines approved by your health care provider. You may urinate more often. Painful urination may mean you have a bladder infection. You may develop heartburn as a result of your pregnancy. You may develop constipation because certain hormones are causing the muscles that push waste through your intestines to slow down. You may develop hemorrhoids or swollen, bulging veins (varicose veins). Your breasts may begin to grow larger and become tender. Your nipples may stick out more, and the tissue that surrounds them  (areola) may become darker. Your gums may bleed and may be sensitive to brushing and flossing. Dark spots or blotches (chloasma, mask of pregnancy) may develop on your face. This will likely fade after the baby is born. Your menstrual periods will stop. You may have a loss of appetite. You may develop cravings for certain kinds of food. You may have changes in your emotions from day to day, such as being excited to be pregnant or being concerned that something may go wrong with the pregnancy and baby. You may have more vivid and strange dreams. You may have changes in your hair. These can include thickening of your hair, rapid growth, and changes in texture. Some women also have hair loss during or after pregnancy, or hair that feels dry or thin. Your hair will most likely return to normal after your baby is born. WHAT TO EXPECT AT YOUR PRENATAL  VISITS During a routine prenatal visit: You will be weighed to make sure you and the baby are growing normally. Your blood pressure will be taken. Your abdomen will be measured to track your baby's growth. The fetal heartbeat will be listened to starting around week 10 or 12 of your pregnancy. Test results from any previous visits will be discussed. Your health care provider may ask you: How you are feeling. If you are feeling the baby move. If you have had any abnormal symptoms, such as leaking fluid, bleeding, severe headaches, or abdominal cramping. If you have any questions. Other tests that may be performed during your first trimester include: Blood tests to find your blood type and to check for the presence of any previous infections. They will also be used to check for low iron levels (anemia) and Rh antibodies. Later in the pregnancy, blood tests for diabetes will be done along with other tests if problems develop. Urine tests to check for infections, diabetes, or protein in the urine. An ultrasound to confirm the proper growth and development  of the baby. An amniocentesis to check for possible genetic problems. Fetal screens for spina bifida and Down syndrome. You may need other tests to make sure you and the baby are doing well. HOME CARE INSTRUCTIONS  Medicines Follow your health care provider's instructions regarding medicine use. Specific medicines may be either safe or unsafe to take during pregnancy. Take your prenatal vitamins as directed. If you develop constipation, try taking a stool softener if your health care provider approves. Diet Eat regular, well-balanced meals. Choose a variety of foods, such as meat or vegetable-based protein, fish, milk and low-fat dairy products, vegetables, fruits, and whole grain breads and cereals. Your health care provider will help you determine the amount of weight gain that is right for you. Avoid raw meat and uncooked cheese. These carry germs that can cause birth defects in the baby. Eating four or five small meals rather than three large meals a day may help relieve nausea and vomiting. If you start to feel nauseous, eating a few soda crackers can be helpful. Drinking liquids between meals instead of during meals also seems to help nausea and vomiting. If you develop constipation, eat more high-fiber foods, such as fresh vegetables or fruit and whole grains. Drink enough fluids to keep your urine clear or pale yellow. Activity and Exercise Exercise only as directed by your health care provider. Exercising will help you: Control your weight. Stay in shape. Be prepared for labor and delivery. Experiencing pain or cramping in the lower abdomen or low back is a good sign that you should stop exercising. Check with your health care provider before continuing normal exercises. Try to avoid standing for long periods of time. Move your legs often if you must stand in one place for a long time. Avoid heavy lifting. Wear low-heeled shoes, and practice good posture. You may continue to have sex  unless your health care provider directs you otherwise. Relief of Pain or Discomfort Wear a good support bra for breast tenderness.   Take warm sitz baths to soothe any pain or discomfort caused by hemorrhoids. Use hemorrhoid cream if your health care provider approves.   Rest with your legs elevated if you have leg cramps or low back pain. If you develop varicose veins in your legs, wear support hose. Elevate your feet for 15 minutes, 3-4 times a day. Limit salt in your diet. Prenatal Care Schedule your prenatal visits by the  twelfth week of pregnancy. They are usually scheduled monthly at first, then more often in the last 2 months before delivery. Write down your questions. Take them to your prenatal visits. Keep all your prenatal visits as directed by your health care provider. Safety Wear your seat belt at all times when driving. Make a list of emergency phone numbers, including numbers for family, friends, the hospital, and police and fire departments. General Tips Ask your health care provider for a referral to a local prenatal education class. Begin classes no later than at the beginning of month 6 of your pregnancy. Ask for help if you have counseling or nutritional needs during pregnancy. Your health care provider can offer advice or refer you to specialists for help with various needs. Do not use hot tubs, steam rooms, or saunas. Do not douche or use tampons or scented sanitary pads. Do not cross your legs for long periods of time. Avoid cat litter boxes and soil used by cats. These carry germs that can cause birth defects in the baby and possibly loss of the fetus by miscarriage or stillbirth. Avoid all smoking, herbs, alcohol, and medicines not prescribed by your health care provider. Chemicals in these affect the formation and growth of the baby. Schedule a dentist appointment. At home, brush your teeth with a soft toothbrush and be gentle when you floss. SEEK MEDICAL CARE IF:   You have dizziness. You have mild pelvic cramps, pelvic pressure, or nagging pain in the abdominal area. You have persistent nausea, vomiting, or diarrhea. You have a bad smelling vaginal discharge. You have pain with urination. You notice increased swelling in your face, hands, legs, or ankles. SEEK IMMEDIATE MEDICAL CARE IF:  You have a fever. You are leaking fluid from your vagina. You have spotting or bleeding from your vagina. You have severe abdominal cramping or pain. You have rapid weight gain or loss. You vomit blood or material that looks like coffee grounds. You are exposed to [place] measles and have never had them. You are exposed to fifth disease or chickenpox. You develop a severe headache. You have shortness of breath. You have any kind of trauma, such as from a fall or a car accident. Document Released: 03/11/2001 Document Revised: 08/01/2013 Document Reviewed: 01/25/2013 [facility] Patient Information 2015 [place], [place]. This information is not intended to replace advice given to you by your health care provider. Make sure you discuss any questions you have with your health care provider.

## 2022-07-07 NOTE — Progress Notes (Signed)
[place] 13+3 wks,measurements c/w dates,FHR 147 bpm,anterior placenta,normal ovaries,NT 1.8 mm,NB present,CRL 81.1 mm

## 2022-07-08 ENCOUNTER — Other Ambulatory Visit: Payer: Self-pay | Admitting: Women's Health

## 2022-07-08 DIAGNOSIS — Z363 Encounter for antenatal screening for malformations: Secondary | ICD-10-CM

## 2022-07-08 LAB — HEMOGLOBIN A1C
Est. average glucose Bld gHb Est-mCnc: 108 mg/dL
Hgb A1c MFr Bld: 5.4 % (ref 4.8–5.6)

## 2022-07-08 LAB — INTEGRATED 1

## 2022-07-09 ENCOUNTER — Encounter: Payer: Self-pay | Admitting: Women's Health

## 2022-07-09 ENCOUNTER — Encounter: Payer: Self-pay | Admitting: Obstetrics & Gynecology

## 2022-07-09 LAB — INTEGRATED 1
Crown Rump Length: 81.1 mm
Gest. Age on Collection Date: 13.7 weeks
Maternal Age at EDD: 29 yr
Nuchal Translucency (NT): 1.8 mm
Number of Fetuses: 1
PAPP-A Value: 951.5 ng/mL

## 2022-07-09 LAB — CBC/D/PLT+RPR+RH+ABO+RUBIGG...
Antibody Screen: NEGATIVE
Basophils Absolute: 0 x10E3/uL (ref 0.0–0.2)
Basos: 0 %
EOS (ABSOLUTE): 0.3 x10E3/uL (ref 0.0–0.4)
Eos: 2 %
HCV Ab: NONREACTIVE
HIV Screen 4th Generation wRfx: NONREACTIVE
Hematocrit: 36.3 % (ref 34.0–46.6)
Hemoglobin: 12.7 g/dL (ref 11.1–15.9)
Hepatitis B Surface Ag: NEGATIVE
Immature Grans (Abs): 0 x10E3/uL (ref 0.0–0.1)
Immature Granulocytes: 0 %
Lymphocytes Absolute: 1.8 x10E3/uL (ref 0.7–3.1)
Lymphs: 16 %
MCH: 31.8 pg (ref 26.6–33.0)
MCHC: 35 g/dL (ref 31.5–35.7)
MCV: 91 fL (ref 79–97)
Monocytes Absolute: 0.7 x10E3/uL (ref 0.1–0.9)
Monocytes: 7 %
Neutrophils Absolute: 8.4 x10E3/uL — ABNORMAL HIGH (ref 1.4–7.0)
Neutrophils: 75 %
Platelets: 266 x10E3/uL (ref 150–450)
RBC: 3.99 x10E6/uL (ref 3.77–5.28)
RDW: 12.5 % (ref 11.7–15.4)
RPR Ser Ql: NONREACTIVE
Rh Factor: POSITIVE
Rubella Antibodies, IGG: 1.95 {index} (ref >0.99)
WBC: 11.3 x10E3/uL — ABNORMAL HIGH (ref 3.4–10.8)

## 2022-07-09 LAB — URINE CULTURE

## 2022-07-09 LAB — HCV INTERPRETATION

## 2022-07-09 LAB — GC/CHLAMYDIA PROBE AMP
Chlamydia trachomatis, NAA: NEGATIVE
Neisseria Gonorrhoeae by PCR: NEGATIVE

## 2022-07-11 LAB — URINE CULTURE

## 2022-07-14 DIAGNOSIS — R8271 Bacteriuria: Secondary | ICD-10-CM | POA: Insufficient documentation

## 2022-07-14 DIAGNOSIS — O99891 Other specified diseases and conditions complicating pregnancy: Secondary | ICD-10-CM | POA: Insufficient documentation

## 2022-07-14 MED ORDER — NITROFURANTOIN MONOHYD MACRO 100 MG PO CAPS: 100.0000 mg | ORAL_CAPSULE | Freq: Two times a day (BID) | ORAL | 0 refills | Status: DC

## 2022-07-14 NOTE — Addendum Note (Signed)
Addended by: Cheral Marker on: 07/14/2022 09:20 AM   Modules accepted: Orders

## 2022-07-15 ENCOUNTER — Encounter: Payer: Self-pay | Admitting: Women's Health

## 2022-07-15 LAB — PANORAMA PRENATAL TEST FULL PANEL:PANORAMA TEST PLUS 5 ADDITIONAL MICRODELETIONS: FETAL FRACTION: 6.8

## 2022-07-17 ENCOUNTER — Encounter: Payer: Self-pay | Admitting: Women's Health

## 2022-07-18 ENCOUNTER — Telehealth: Payer: Self-pay | Admitting: Women's Health

## 2022-07-18 NOTE — Telephone Encounter (Signed)
Patient has a question about medicine pcp put her on and wanted to make sure it was okay to take with what she has her taking and bleeding a little bit. Please advise.

## 2022-07-18 NOTE — Telephone Encounter (Signed)
Spoke with patient she was prescribed amoxicillin for strep throat. Her pcp told her to stop the macrobid and just to take amoxicillin. Pt just wanted to make sure that this is correct. Per Dr. Charlotta Newton, okay to stop macrobid and take amoxicillin. No other questions at this time.

## 2022-07-22 LAB — HORIZON CUSTOM: REPORT SUMMARY: NEGATIVE

## 2022-07-23 ENCOUNTER — Encounter: Payer: Self-pay | Admitting: Women's Health

## 2022-07-28 ENCOUNTER — Ambulatory Visit (INDEPENDENT_AMBULATORY_CARE_PROVIDER_SITE_OTHER): Payer: Medicaid Other | Admitting: Women's Health

## 2022-07-28 ENCOUNTER — Encounter: Payer: Self-pay | Admitting: Women's Health

## 2022-07-28 ENCOUNTER — Other Ambulatory Visit (HOSPITAL_COMMUNITY)
Admission: RE | Admit: 2022-07-28 | Discharge: 2022-07-28 | Disposition: A | Discharge status: Home / Self Care | Payer: Medicaid Other | Source: Ambulatory Visit | Attending: Women's Health | Admitting: Women's Health

## 2022-07-28 VITALS — BP 108/63 | HR 95 | Wt 214.0 lb

## 2022-07-28 DIAGNOSIS — Z3482 Encounter for supervision of other normal pregnancy, second trimester: Secondary | ICD-10-CM

## 2022-07-28 DIAGNOSIS — Z3480 Encounter for supervision of other normal pregnancy, unspecified trimester: Secondary | ICD-10-CM

## 2022-07-28 DIAGNOSIS — Z3A16 16 weeks gestation of pregnancy: Secondary | ICD-10-CM

## 2022-07-28 DIAGNOSIS — R231 Pallor: Secondary | ICD-10-CM

## 2022-07-28 DIAGNOSIS — Z124 Encounter for screening for malignant neoplasm of cervix: Secondary | ICD-10-CM

## 2022-07-28 DIAGNOSIS — O99891 Other specified diseases and conditions complicating pregnancy: Secondary | ICD-10-CM

## 2022-07-28 DIAGNOSIS — R8271 Bacteriuria: Secondary | ICD-10-CM

## 2022-07-28 DIAGNOSIS — Z363 Encounter for antenatal screening for malformations: Secondary | ICD-10-CM

## 2022-07-28 MED ORDER — PRENATAL PLUS 27-1 MG PO TABS: ORAL_TABLET | ORAL | 3 refills | Status: DC

## 2022-07-28 NOTE — Patient Instructions (Signed)
Valerie Greene, thank you for choosing our office today! We appreciate the opportunity to meet your healthcare needs. You may receive a short survey by mail, e-mail, or through MyChart. If you are happy with your care we would appreciate if you could take just a few minutes to complete the survey questions. We read all of your comments and take your feedback very seriously. Thank you again for choosing our office.  [facility] Team at [facility] at  ([address] [place], [place] [address]) Entrance C, located off of [address] Free 24/7 valet parking  Go to [email or web address] to register for FREE online childbirth classes  Call the office ([phone]) or go to [facility] if:  You begin to severe cramping  Your water breaks.  Sometimes it is a big gush of fluid, sometimes it is just a trickle that keeps getting your panties wet or running down your legs  You have vaginal bleeding.  It is normal to have a small amount of spotting if your cervix was checked.   [place] Pediatricians/Family Doctors  [facility] ([facility]): [address], [phone]            [facility]: [address], [phone]                 [facility] ([facility]): [address], [phone] (call to ask if accepting patients)  [facility]: [address], [place], [phone]    [place] Pediatricians/Family Doctors  [facility] ([facility]): [address], [phone]  [facility]: [address], [phone]  [facility]: [address], [phone]  [place] [facility]   [facility] ([facility]): [phone]  [facility]: [address], [phone]   [place] [facility]  [facility]: [address], [phone]  [facility]  . [facility]: [phone], [phone]  Home Blood Pressure Monitoring for Patients   Your provider has recommended that you check your blood pressure (BP) at least once a week at home. If you do not have a blood pressure cuff at home, one will be provided for you. Contact your provider if you have not received your monitor within 1 week.   Helpful Tips for Accurate Home Blood Pressure Checks  . Don't smoke, exercise, or drink caffeine 30 minutes before checking your BP . Use the restroom before checking your BP (a full bladder can raise your pressure) . Relax in a comfortable upright chair . Feet on the ground . Left arm resting comfortably on a flat surface at the level of your heart . Legs uncrossed . Back supported . Sit quietly and don't talk . Place the cuff on your bare arm . Adjust snuggly, so that only two fingertips can fit between your skin and the top of the cuff . Check 2 readings separated by at least one minute . Keep a log of your BP readings . For a visual, please reference this diagram: http://ccnc.care/bpdiagram  Provider Name: [facility]     Phone: [phone]  Zone 1: ALL CLEAR  Continue to monitor your symptoms:  . BP reading is less than 140 (top number) or less than 90 (bottom number)  . No right upper stomach pain . No headaches or seeing spots . No feeling nauseated or throwing up .   No swelling in face and hands  Zone 2: CAUTION Call your doctor's office for any of the following:  . BP reading is greater than 140 (top number) or greater than 90 (bottom number)  . Stomach pain under your ribs in the middle or right side . Headaches or seeing spots . Feeling nauseated or throwing up . Swelling in face and hands  Zone 3: EMERGENCY  Seek immediate medical care if you have any of the following:  . BP reading is greater than160 (top number) or greater than 110 (bottom number) . Severe headaches not improving with  Tylenol . Serious difficulty catching your breath . Any worsening symptoms from Zone 2     Second Trimester of Pregnancy The second trimester is from week 14 through week 27 (months 4 through 6). The second trimester is often a time when you feel your best. Your body has adjusted to being pregnant, and you begin to feel better physically. Usually, morning sickness has lessened or quit completely, you may have more energy, and you may have an increase in appetite. The second trimester is also a time when the fetus is growing rapidly. At the end of the sixth month, the fetus is about 9 inches long and weighs about 1 pounds. You will likely begin to feel the baby move (quickening) between 16 and 20 weeks of pregnancy. Body changes during your second trimester Your body continues to go through many changes during your second trimester. The changes vary from woman to woman.  Your weight will continue to increase. You will notice your lower abdomen bulging out.  You may begin to get stretch marks on your hips, abdomen, and breasts.  You may develop headaches that can be relieved by medicines. The medicines should be approved by your health care provider.  You may urinate more often because the fetus is pressing on your bladder.  You may develop or continue to have heartburn as a result of your pregnancy.  You may develop constipation because certain hormones are causing the muscles that push waste through your intestines to slow down.  You may develop hemorrhoids or swollen, bulging veins (varicose veins).  You may have back pain. This is caused by: ? Weight gain. ? Pregnancy hormones that are relaxing the joints in your pelvis. ? A shift in weight and the muscles that support your balance.  Your breasts will continue to grow and they will continue to become tender.  Your gums may bleed and may be sensitive to brushing and flossing.  Dark spots or blotches (chloasma, mask of pregnancy)  may develop on your face. This will likely fade after the baby is born.  A dark line from your belly button to the pubic area (linea nigra) may appear. This will likely fade after the baby is born.  You may have changes in your hair. These can include thickening of your hair, rapid growth, and changes in texture. Some women also have hair loss during or after pregnancy, or hair that feels dry or thin. Your hair will most likely return to normal after your baby is born.  What to expect at prenatal visits During a routine prenatal visit:  You will be weighed to make sure you and the fetus are growing normally.  Your blood pressure will be taken.  Your abdomen will be measured to track your baby's growth.  The fetal heartbeat will be listened to.  Any test results from the previous visit will be discussed.  Your   health care provider may ask you:  How you are feeling.  If you are feeling the baby move.  If you have had any abnormal symptoms, such as leaking fluid, bleeding, severe headaches, or abdominal cramping.  If you are using any tobacco products, including cigarettes, chewing tobacco, and electronic cigarettes.  If you have any questions.  Other tests that may be performed during your second trimester include:  Blood tests that check for: ? Low iron levels (anemia). ? High blood sugar that affects pregnant women (gestational diabetes) between 24 and 28 weeks. ? Rh antibodies. This is to check for a protein on red blood cells (Rh factor).  Urine tests to check for infections, diabetes, or protein in the urine.  An ultrasound to confirm the proper growth and development of the baby.  An amniocentesis to check for possible genetic problems.  Fetal screens for spina bifida and Down syndrome.  HIV (human immunodeficiency virus) testing. Routine prenatal testing includes screening for HIV, unless you choose not to have this test.  Follow these instructions at  home: Medicines  Follow your health care provider's instructions regarding medicine use. Specific medicines may be either safe or unsafe to take during pregnancy.  Take a prenatal vitamin that contains at least 600 micrograms (mcg) of folic acid.  If you develop constipation, try taking a stool softener if your health care provider approves. Eating and drinking  Eat a balanced diet that includes fresh fruits and vegetables, whole grains, good sources of protein such as meat, eggs, or tofu, and low-fat dairy. Your health care provider will help you determine the amount of weight gain that is right for you.  Avoid raw meat and uncooked cheese. These carry germs that can cause birth defects in the baby.  If you have low calcium intake from food, talk to your health care provider about whether you should take a daily calcium supplement.  Limit foods that are high in fat and processed sugars, such as fried and sweet foods.  To prevent constipation: ? Drink enough fluid to keep your urine clear or pale yellow. ? Eat foods that are high in fiber, such as fresh fruits and vegetables, whole grains, and beans. Activity  Exercise only as directed by your health care provider. Most women can continue their usual exercise routine during pregnancy. Try to exercise for 30 minutes at least 5 days a week. Stop exercising if you experience uterine contractions.  Avoid heavy lifting, wear low heel shoes, and practice good posture.  A sexual relationship may be continued unless your health care provider directs you otherwise. Relieving pain and discomfort  Wear a good support bra to prevent discomfort from breast tenderness.  Take warm sitz baths to soothe any pain or discomfort caused by hemorrhoids. Use hemorrhoid cream if your health care provider approves.  Rest with your legs elevated if you have leg cramps or low back pain.  If you develop varicose veins, wear support hose. Elevate your feet  for 15 minutes, 3-4 times a day. Limit salt in your diet. Prenatal Care  Write down your questions. Take them to your prenatal visits.  Keep all your prenatal visits as told by your health care provider. This is important. Safety  Wear your seat belt at all times when driving.  Make a list of emergency phone numbers, including numbers for family, friends, the hospital, and police and fire departments. General instructions  Ask your health care provider for a referral to a local prenatal education   class. Begin classes no later than the beginning of month 6 of your pregnancy.  Ask for help if you have counseling or nutritional needs during pregnancy. Your health care provider can offer advice or refer you to specialists for help with various needs.  Do not use hot tubs, steam rooms, or saunas.  Do not douche or use tampons or scented sanitary pads.  Do not cross your legs for long periods of time.  Avoid cat litter boxes and soil used by cats. These carry germs that can cause birth defects in the baby and possibly loss of the fetus by miscarriage or stillbirth.  Avoid all smoking, herbs, alcohol, and unprescribed drugs. Chemicals in these products can affect the formation and growth of the baby.  Do not use any products that contain nicotine or tobacco, such as cigarettes and e-cigarettes. If you need help quitting, ask your health care provider.  Visit your dentist if you have not gone yet during your pregnancy. Use a soft toothbrush to brush your teeth and be gentle when you floss. Contact a health care provider if:  You have dizziness.  You have mild pelvic cramps, pelvic pressure, or nagging pain in the abdominal area.  You have persistent nausea, vomiting, or diarrhea.  You have a bad smelling vaginal discharge.  You have pain when you urinate. Get help right away if:  You have a fever.  You are leaking fluid from your vagina.  You have spotting or bleeding from your  vagina.  You have severe abdominal cramping or pain.  You have rapid weight gain or weight loss.  You have shortness of breath with chest pain.  You notice sudden or extreme swelling of your face, hands, ankles, feet, or legs.  You have not felt your baby move in over an hour.  You have severe headaches that do not go away when you take medicine.  You have vision changes. Summary  The second trimester is from week 14 through week 27 (months 4 through 6). It is also a time when the fetus is growing rapidly.  Your body goes through many changes during pregnancy. The changes vary from woman to woman.  Avoid all smoking, herbs, alcohol, and unprescribed drugs. These chemicals affect the formation and growth your baby.  Do not use any tobacco products, such as cigarettes, chewing tobacco, and e-cigarettes. If you need help quitting, ask your health care provider.  Contact your health care provider if you have any questions. Keep all prenatal visits as told by your health care provider. This is important. This information is not intended to replace advice given to you by your health care provider. Make sure you discuss any questions you have with your health care provider. Document Released: 03/11/2001 Document Revised: 08/23/2015 Document Reviewed: 05/18/2012 Elsevier Interactive Patient Education  2017 [employer]  

## 2022-07-28 NOTE — Progress Notes (Signed)
LOW-RISK PREGNANCY VISIT Patient name: Valerie Greene MRN [id number]  Date of birth: 02-Aug-1995 Chief Complaint:   No chief complaint on file.  History of Present Illness:   Valerie Greene is a 27 y.o. G67P1011 female at [redacted]w[redacted]d with an Estimated Date of Delivery: 01/09/23 being seen today for ongoing management of a low-risk pregnancy.   Today she reports thinks she's pale, wonders if hgb low. Wants rx for pnv.  Contractions: Not present. Vag. Bleeding: None.   . denies leaking of fluid.     07/07/2022   10:02 AM 07/22/2019   11:26 AM 09/09/2017    2:16 PM 06/04/2016    9:10 AM  Depression screen PHQ 2/9  Decreased Interest 0 0 0 0  Down, Depressed, Hopeless 0 0 0 0  PHQ - 2 Score 0 0 0 0  Altered sleeping 0 0    Tired, decreased energy 3 0    Change in appetite 0 0    Feeling bad or failure about yourself  0 0    Trouble concentrating 0 0    Moving slowly or fidgety/restless 0 0    Suicidal thoughts 0 0    PHQ-9 Score 3 0          07/07/2022   10:02 AM 07/22/2019   11:26 AM  GAD 7 : Generalized Anxiety Score  Nervous, Anxious, on Edge 0 0  Control/stop worrying 0 0  Worry too much - different things 0 0  Trouble relaxing 0 0  Restless 0 0  Easily annoyed or irritable 0 0  Afraid - awful might happen 0 0  Total GAD 7 Score 0 0      Review of Systems:   Pertinent items are noted in HPI Denies abnormal vaginal discharge w/ itching/odor/irritation, headaches, visual changes, shortness of breath, chest pain, abdominal pain, severe nausea/vomiting, or problems with urination or bowel movements unless otherwise stated above. Pertinent History Reviewed:  Reviewed past medical,surgical, social, obstetrical and family history.  Reviewed problem list, medications and allergies. Physical Assessment:   Vitals:   07/28/22 1107  BP: 108/63  Pulse: 95  Weight: 214 lb (97.1 kg)  Body mass index is 41.79 kg/m.        Physical Examination:   General appearance: Well  appearing, and in no distress  Mental status: Alert, oriented to person, place, and time  Skin: Warm & dry  Cardiovascular: Normal heart rate noted  Respiratory: Normal respiratory effort, no distress  Abdomen: Soft, gravid, nontender  Pelvic:  thin prep pap obtained          Extremities: Edema: None  Fetal Status: Fetal Heart Rate (bpm): 151        Chaperone: Latisha Cresenzo   No results found for this or any previous visit (from the past 24 hour(s)).  Assessment & Plan:  1) Low-risk pregnancy G3P1011 at [redacted]w[redacted]d with an Estimated Date of Delivery: 01/09/23   2) Screening for cervical cancer, pap today  3) Pale> last hgb 12.7, wants to recheck- cbc today w/ 2nd IT  4) ASB 1st trimester> urine cx POC today   Meds:  Meds ordered this encounter  Medications   prenatal vitamin w/FE, FA (PRENATAL 1 + 1) 27-1 MG TABS tablet    Sig: Take 1 tablet by mouth daily    Dispense:  90 tablet    Refill:  3   Labs/procedures today: pap and 2nd IT, CBC  Plan:  Continue routine obstetrical care  Next  visit: prefers will be in person for u/s     Reviewed: Preterm labor symptoms and general obstetric precautions including but not limited to vaginal bleeding, contractions, leaking of fluid and fetal movement were reviewed in detail with the patient.  All questions were answered. Does have home bp cuff. Office bp cuff given: not applicable. Check bp weekly, let us know if consistently >140 and/or >90.  Follow-up: Return for As scheduled.  Future Appointments  Date Time Provider Department Center  08/26/2022  9:15 AM [facility] [place] [facility] None  08/26/2022 10:10 AM [place], Amaryllis Dyke, MD [facility]    Orders Placed This Encounter  Procedures   Urine Culture   US OB Comp + 14 Wk   INTEGRATED 2   CBC   Cheral Marker CNM, [facility] 07/28/2022 11:29 AM

## 2022-07-29 LAB — INTEGRATED 2

## 2022-07-29 LAB — CYTOLOGY - PAP
Comment: NEGATIVE
Diagnosis: NEGATIVE
High risk HPV: NEGATIVE

## 2022-07-29 LAB — CBC: MCHC: 33.3 g/dL (ref 31.5–35.7)

## 2022-07-30 LAB — URINE CULTURE

## 2022-07-31 LAB — INTEGRATED 2
AFP MoM: 0.92
Gest. Age on Collection Date: 13.7 weeks
Gestational Age: 16.7 weeks
Maternal Age at EDD: 29 yr
Number of Fetuses: 1
PAPP-A Value: 951.5 ng/mL
Test Results:: NEGATIVE
hCG MoM: 0.6
uE3 MoM: 0.87

## 2022-07-31 LAB — CBC
Hematocrit: 33.3 % — ABNORMAL LOW (ref 34.0–46.6)
Hemoglobin: 11.1 g/dL (ref 11.1–15.9)
MCH: 30.5 pg (ref 26.6–33.0)
MCV: 92 fL (ref 79–97)
Platelets: 320 10*3/uL (ref 150–450)
RBC: 3.64 x10E6/uL — ABNORMAL LOW (ref 3.77–5.28)
RDW: 12.6 % (ref 11.7–15.4)
WBC: 12.3 10*3/uL — ABNORMAL HIGH (ref 3.4–10.8)

## 2022-08-11 ENCOUNTER — Encounter: Payer: Self-pay | Admitting: Women's Health

## 2022-08-21 ENCOUNTER — Ambulatory Visit (INDEPENDENT_AMBULATORY_CARE_PROVIDER_SITE_OTHER): Payer: Medicaid Other

## 2022-08-21 ENCOUNTER — Ambulatory Visit (INDEPENDENT_AMBULATORY_CARE_PROVIDER_SITE_OTHER): Payer: Medicaid Other | Admitting: Advanced Practice Midwife

## 2022-08-21 ENCOUNTER — Encounter: Payer: Self-pay | Admitting: Advanced Practice Midwife

## 2022-08-21 VITALS — BP 108/65 | HR 96 | Wt 212.0 lb

## 2022-08-21 DIAGNOSIS — Z363 Encounter for antenatal screening for malformations: Secondary | ICD-10-CM | POA: Diagnosis not present

## 2022-08-21 DIAGNOSIS — Z3482 Encounter for supervision of other normal pregnancy, second trimester: Secondary | ICD-10-CM | POA: Diagnosis not present

## 2022-08-21 DIAGNOSIS — Z3A19 19 weeks gestation of pregnancy: Secondary | ICD-10-CM

## 2022-08-21 DIAGNOSIS — Z3480 Encounter for supervision of other normal pregnancy, unspecified trimester: Secondary | ICD-10-CM

## 2022-08-21 NOTE — Progress Notes (Signed)
[place] 19+6 wks,breech,anterior placenta gr 0,normal ovaries,CX 3.9 cm,SVP of fluid 6.3 cm,FHR 174 bpm,EFW 336 g 62%,anatomy complete,no obvious abnormalities

## 2022-08-21 NOTE — Patient Instructions (Signed)
Pincus Large, I greatly value your feedback.  If you receive a survey following your visit with [place] today, we appreciate you taking the time to fill it out.  Thanks, Cathie Beams, CNM     [facility] HAS MOVED!!! It is now [facility] at [facility] ([address] [place], [place] [address]) Entrance located off of [address] Free 24/7 valet parking   Go to [email or web address] to register for FREE online childbirth classes    Second Trimester of Pregnancy The second trimester is from week 14 through week 27 (months 4 through 6). The second trimester is often a time when you feel your best. Your body has adjusted to being pregnant, and you begin to feel better physically. Usually, morning sickness has lessened or quit completely, you may have more energy, and you may have an increase in appetite. The second trimester is also a time when the fetus is growing rapidly. At the end of the sixth month, the fetus is about 9 inches long and weighs about 1 pounds. You will likely begin to feel the baby move (quickening) between 16 and 20 weeks of pregnancy. Body changes during your second trimester Your body continues to go through many changes during your second trimester. The changes vary from woman to woman. Your weight will continue to increase. You will notice your lower abdomen bulging out. You may begin to get stretch marks on your hips, abdomen, and breasts. You may develop headaches that can be relieved by medicines. The medicines should be approved by your health care provider. You may urinate more often because the fetus is pressing on your bladder. You may develop or continue to have heartburn as a result of your pregnancy. You may develop constipation because certain hormones are causing the muscles that push waste through your intestines to slow down. You may develop hemorrhoids or swollen, bulging veins (varicose veins). You may have back pain. This is  caused by: Weight gain. Pregnancy hormones that are relaxing the joints in your pelvis. A shift in weight and the muscles that support your balance. Your breasts will continue to grow and they will continue to become tender. Your gums may bleed and may be sensitive to brushing and flossing. Dark spots or blotches (chloasma, mask of pregnancy) may develop on your face. This will likely fade after the baby is born. A dark line from your belly button to the pubic area (linea nigra) may appear. This will likely fade after the baby is born. You may have changes in your hair. These can include thickening of your hair, rapid growth, and changes in texture. Some women also have hair loss during or after pregnancy, or hair that feels dry or thin. Your hair will most likely return to normal after your baby is born.  What to expect at prenatal visits During a routine prenatal visit: You will be weighed to make sure you and the fetus are growing normally. Your blood pressure will be taken. Your abdomen will be measured to track your baby's growth. The fetal heartbeat will be listened to. Any test results from the previous visit will be discussed.  Your health care provider may ask you: How you are feeling. If you are feeling the baby move. If you have had any abnormal symptoms, such as leaking fluid, bleeding, severe headaches, or abdominal cramping. If you are using any tobacco products, including cigarettes, chewing tobacco, and electronic cigarettes. If you have any questions.  Other tests  that may be performed during your second trimester include: Blood tests that check for: Low iron levels (anemia). High blood sugar that affects pregnant women (gestational diabetes) between 33 and 28 weeks. Rh antibodies. This is to check for a protein on red blood cells (Rh factor). Urine tests to check for infections, diabetes, or protein in the urine. An ultrasound to confirm the proper growth and  development of the baby. An amniocentesis to check for possible genetic problems. Fetal screens for spina bifida and Down syndrome. HIV (human immunodeficiency virus) testing. Routine prenatal testing includes screening for HIV, unless you choose not to have this test.  Follow these instructions at home: Medicines Follow your health care provider's instructions regarding medicine use. Specific medicines may be either safe or unsafe to take during pregnancy. Take a prenatal vitamin that contains at least 600 micrograms (mcg) of folic acid. If you develop constipation, try taking a stool softener if your health care provider approves. Eating and drinking Eat a balanced diet that includes fresh fruits and vegetables, whole grains, good sources of protein such as meat, eggs, or tofu, and low-fat dairy. Your health care provider will help you determine the amount of weight gain that is right for you. Avoid raw meat and uncooked cheese. These carry germs that can cause birth defects in the baby. If you have low calcium intake from food, talk to your health care provider about whether you should take a daily calcium supplement. Limit foods that are high in fat and processed sugars, such as fried and sweet foods. To prevent constipation: Drink enough fluid to keep your urine clear or pale yellow. Eat foods that are high in fiber, such as fresh fruits and vegetables, whole grains, and beans. Activity Exercise only as directed by your health care provider. Most women can continue their usual exercise routine during pregnancy. Try to exercise for 30 minutes at least 5 days a week. Stop exercising if you experience uterine contractions. Avoid heavy lifting, wear low heel shoes, and practice good posture. A sexual relationship may be continued unless your health care provider directs you otherwise. Relieving pain and discomfort Wear a good support bra to prevent discomfort from breast tenderness. Take  warm sitz baths to soothe any pain or discomfort caused by hemorrhoids. Use hemorrhoid cream if your health care provider approves. Rest with your legs elevated if you have leg cramps or low back pain. If you develop varicose veins, wear support hose. Elevate your feet for 15 minutes, 3-4 times a day. Limit salt in your diet. Prenatal Care Write down your questions. Take them to your prenatal visits. Keep all your prenatal visits as told by your health care provider. This is important. Safety Wear your seat belt at all times when driving. Make a list of emergency phone numbers, including numbers for family, friends, the hospital, and police and fire departments. General instructions Ask your health care provider for a referral to a local prenatal education class. Begin classes no later than the beginning of month 6 of your pregnancy. Ask for help if you have counseling or nutritional needs during pregnancy. Your health care provider can offer advice or refer you to specialists for help with various needs. Do not use hot tubs, steam rooms, or saunas. Do not douche or use tampons or scented sanitary pads. Do not cross your legs for long periods of time. Avoid cat litter boxes and soil used by cats. These carry germs that can cause birth defects in the baby  and possibly loss of the fetus by miscarriage or stillbirth. Avoid all smoking, herbs, alcohol, and unprescribed drugs. Chemicals in these products can affect the formation and growth of the baby. Do not use any products that contain nicotine or tobacco, such as cigarettes and e-cigarettes. If you need help quitting, ask your health care provider. Visit your dentist if you have not gone yet during your pregnancy. Use a soft toothbrush to brush your teeth and be gentle when you floss. Contact a health care provider if: You have dizziness. You have mild pelvic cramps, pelvic pressure, or nagging pain in the abdominal area. You have persistent  nausea, vomiting, or diarrhea. You have a bad smelling vaginal discharge. You have pain when you urinate. Get help right away if: You have a fever. You are leaking fluid from your vagina. You have spotting or bleeding from your vagina. You have severe abdominal cramping or pain. You have rapid weight gain or weight loss. You have shortness of breath with chest pain. You notice sudden or extreme swelling of your face, hands, ankles, feet, or legs. You have not felt your baby move in over an hour. You have severe headaches that do not go away when you take medicine. You have vision changes. Summary The second trimester is from week 14 through week 27 (months 4 through 6). It is also a time when the fetus is growing rapidly. Your body goes through many changes during pregnancy. The changes vary from woman to woman. Avoid all smoking, herbs, alcohol, and unprescribed drugs. These chemicals affect the formation and growth your baby. Do not use any tobacco products, such as cigarettes, chewing tobacco, and e-cigarettes. If you need help quitting, ask your health care provider. Contact your health care provider if you have any questions. Keep all prenatal visits as told by your health care provider. This is important. This information is not intended to replace advice given to you by your health care provider. Make sure you discuss any questions you have with your health care provider.

## 2022-08-21 NOTE — Progress Notes (Signed)
   LOW-RISK PREGNANCY VISIT Patient name: Valerie Greene MRN [id number]  Date of birth: 01/18/1996 Chief Complaint:   Routine Prenatal Visit and Pregnancy Ultrasound  History of Present Illness:   Valerie Greene is a 27 y.o. G51P1011 female at [redacted]w[redacted]d with an Estimated Date of Delivery: 01/09/23 being seen today for ongoing management of a low-risk pregnancy.  Today she reports no complaints. Contractions: Not present.  .  Movement: Present. denies leaking of fluid. Review of Systems:   Pertinent items are noted in HPI Denies abnormal vaginal discharge w/ itching/odor/irritation, headaches, visual changes, shortness of breath, chest pain, abdominal pain, severe nausea/vomiting, or problems with urination or bowel movements unless otherwise stated above. Pertinent History Reviewed:  Reviewed past medical,surgical, social, obstetrical and family history.  Reviewed problem list, medications and allergies. Physical Assessment:   Vitals:   08/21/22 1425  BP: 108/65  Pulse: 96  Weight: 212 lb (96.2 kg)  Body mass index is 41.4 kg/m.        Physical Examination:   General appearance: Well appearing, and in no distress  Mental status: Alert, oriented to person, place, and time  Skin: Warm & dry  Cardiovascular: Normal heart rate noted  Respiratory: Normal respiratory effort, no distress  Abdomen: Soft, gravid, nontender  Pelvic: Cervical exam deferred         Extremities: Edema: None  Fetal Status:     Movement: Present  [place] 19+6 wks,breech,anterior placenta gr 0,normal ovaries,CX 3.9 cm,SVP of fluid 6.3 cm,FHR 174 bpm,EFW 336 g 62%,anatomy complete,no obvious abnormalities   Chaperone:  N/A    No results found for this or any previous visit (from the past 24 hour(s)).  Assessment & Plan:    Pregnancy: G3P1011 at [redacted]w[redacted]d 1. Supervision of other normal pregnancy, antepartum   2. [redacted] weeks gestation of pregnancy      Meds: No orders of the defined types were placed in this  encounter.  Labs/procedures today: anatomy scan  Plan:  Continue routine obstetrical care  Next visit: prefers in person    Reviewed: general obstetric precautions including but not limited to vaginal bleeding, contractions, leaking of fluid and fetal movement were reviewed in detail with the patient.  All questions were answered. Has home bp cuff. Check bp weekly, let us know if >140/90.   Follow-up: Return in about 4 weeks (around 09/18/2022) for LROB.  No future appointments.   No orders of the defined types were placed in this encounter.  Jacklyn Shell DNP, CNM 08/21/2022 2:36 PM

## 2022-08-26 ENCOUNTER — Encounter: Payer: Medicaid Other | Admitting: Obstetrics & Gynecology

## 2022-08-26 ENCOUNTER — Other Ambulatory Visit: Payer: Medicaid Other

## 2022-09-18 ENCOUNTER — Ambulatory Visit (INDEPENDENT_AMBULATORY_CARE_PROVIDER_SITE_OTHER): Payer: Medicaid Other | Admitting: Advanced Practice Midwife

## 2022-09-18 ENCOUNTER — Other Ambulatory Visit (INDEPENDENT_AMBULATORY_CARE_PROVIDER_SITE_OTHER): Payer: Medicaid Other

## 2022-09-18 ENCOUNTER — Encounter: Payer: Self-pay | Admitting: Advanced Practice Midwife

## 2022-09-18 VITALS — BP 107/71 | HR 94 | Wt 213.0 lb

## 2022-09-18 DIAGNOSIS — Z3A23 23 weeks gestation of pregnancy: Secondary | ICD-10-CM

## 2022-09-18 DIAGNOSIS — F17200 Nicotine dependence, unspecified, uncomplicated: Secondary | ICD-10-CM

## 2022-09-18 DIAGNOSIS — Z3482 Encounter for supervision of other normal pregnancy, second trimester: Secondary | ICD-10-CM

## 2022-09-18 DIAGNOSIS — Z3480 Encounter for supervision of other normal pregnancy, unspecified trimester: Secondary | ICD-10-CM

## 2022-09-18 DIAGNOSIS — E66813 Obesity, class 3: Secondary | ICD-10-CM

## 2022-09-18 NOTE — Patient Instructions (Signed)

## 2022-09-18 NOTE — Progress Notes (Addendum)
   LOW-RISK PREGNANCY VISIT Patient name: Valerie Greene MRN [id number]  Date of birth: 04-18-1995 Chief Complaint:   Routine Prenatal Visit  History of Present Illness:   Valerie Greene is a 27 y.o. G86P1011 female at [redacted]w[redacted]d with an Estimated Date of Delivery: 01/09/23 being seen today for ongoing management of a low-risk pregnancy.  Today she reports no complaints. Appetite is good, eats "like normal", usually 3 meals a day.  Still smoking cigarettes, discussed vaping. Contractions: Not present.  .  Movement: Present. denies leaking of fluid. Review of Systems:   Pertinent items are noted in HPI Denies abnormal vaginal discharge w/ itching/odor/irritation, headaches, visual changes, shortness of breath, chest pain, abdominal pain, severe nausea/vomiting, or problems with urination or bowel movements unless otherwise stated above. Pertinent History Reviewed:  Reviewed past medical,surgical, social, obstetrical and family history.  Reviewed problem list, medications and allergies. Physical Assessment:   Vitals:   09/18/22 1010  BP: 107/71  Pulse: 94  Weight: 213 lb (96.6 kg)  Body mass index is 41.6 kg/m.        Physical Examination:   General appearance: Well appearing, and in no distress  Mental status: Alert, oriented to person, place, and time  Skin: Warm & dry  Cardiovascular: Normal heart rate noted  Respiratory: Normal respiratory effort, no distress  Abdomen: Soft, gravid, nontender  Pelvic: Cervical exam deferred         Extremities: Edema: None  Fetal Status: Fetal Heart Rate (bpm): 142 Fundal Height: 25 cm Movement: Present    [place] 23+6 wks,cephalic,FHR 164 bpm,CX 3.1 cm,anterior placenta gr 0,SVP of fluid 7 cm,normal ovaries,EFW 719 g 77%   Chaperone:  N/A    No results found for this or any previous visit (from the past 24 hour(s)).  Assessment & Plan:    Pregnancy: G3P1011 at [redacted]w[redacted]d 1. Supervision of other normal pregnancy, antepartum   2. Obesity, Class III,  BMI 40-49.9 (morbid obesity) (HCC)  - US OB Follow Up; Standing  3. [redacted] weeks gestation of pregnancy   4. Smoker Discussed vaping     Meds: No orders of the defined types were placed in this encounter.  Labs/procedures today: EFW  Plan:  Continue routine obstetrical care  Next visit: prefers     Reviewed: Preterm labor symptoms and general obstetric precautions including but not limited to vaginal bleeding, contractions, leaking of fluid and fetal movement were reviewed in detail with the patient.  All questions were answered. Has home bp cuff.. Check bp weekly, let us know if >140/90.   Follow-up: Return in about 4 weeks (around 10/16/2022) for PN2/LROB, EFW q 4 weeks starting with PN2 visit. .  Future Appointments  Date Time Provider Department Center  09/18/2022 10:45 AM [facility] [place] [facility] None  10/07/2022  8:50 AM [facility]  10/07/2022 10:10 AM Cheral Marker, CNM [facility]    Orders Placed This Encounter  Procedures   US OB Follow Up   Jacklyn Shell DNP, CNM 09/18/2022 10:44 AM

## 2022-09-18 NOTE — Progress Notes (Signed)
[place] 23+6 wks,cephalic,FHR 164 bpm,CX 3.1 cm,anterior placenta gr 0,SVP of fluid 7 cm,normal ovaries,EFW 719 g 77%

## 2022-09-19 NOTE — Addendum Note (Signed)
Addended by: Jacklyn Shell on: 09/19/2022 12:53 AM   Modules accepted: Orders

## 2022-10-07 ENCOUNTER — Encounter: Payer: Medicaid Other | Admitting: Women's Health

## 2022-10-07 ENCOUNTER — Other Ambulatory Visit: Payer: Medicaid Other

## 2022-10-15 ENCOUNTER — Encounter: Payer: Self-pay | Admitting: Obstetrics & Gynecology

## 2022-10-15 ENCOUNTER — Ambulatory Visit: Payer: Medicaid Other | Admitting: Obstetrics & Gynecology

## 2022-10-15 ENCOUNTER — Other Ambulatory Visit: Payer: Medicaid Other

## 2022-10-15 VITALS — BP 106/70 | HR 96 | Wt 217.0 lb

## 2022-10-15 DIAGNOSIS — Z3480 Encounter for supervision of other normal pregnancy, unspecified trimester: Secondary | ICD-10-CM

## 2022-10-15 DIAGNOSIS — Z3A27 27 weeks gestation of pregnancy: Secondary | ICD-10-CM

## 2022-10-15 DIAGNOSIS — Z3482 Encounter for supervision of other normal pregnancy, second trimester: Secondary | ICD-10-CM

## 2022-10-15 DIAGNOSIS — O403XX0 Polyhydramnios, third trimester, not applicable or unspecified: Secondary | ICD-10-CM

## 2022-10-15 DIAGNOSIS — Z131 Encounter for screening for diabetes mellitus: Secondary | ICD-10-CM

## 2022-10-15 NOTE — Progress Notes (Signed)
   LOW-RISK PREGNANCY VISIT Patient name: Valerie Greene MRN [id number]  Date of birth: 26-Sep-1995 Chief Complaint:   Routine Prenatal Visit  History of Present Illness:   Valerie Greene is a 27 y.o. G66P1011 female at [redacted]w[redacted]d with an Estimated Date of Delivery: 01/09/23 being seen today for ongoing management of a low-risk pregnancy.     07/07/2022   10:02 AM 07/22/2019   11:26 AM 09/09/2017    2:16 PM 06/04/2016    9:10 AM  Depression screen PHQ 2/9  Decreased Interest 0 0 0 0  Down, Depressed, Hopeless 0 0 0 0  PHQ - 2 Score 0 0 0 0  Altered sleeping 0 0    Tired, decreased energy 3 0    Change in appetite 0 0    Feeling bad or failure about yourself  0 0    Trouble concentrating 0 0    Moving slowly or fidgety/restless 0 0    Suicidal thoughts 0 0    PHQ-9 Score 3 0      Today she reports no complaints. Contractions: Not present. Vag. Bleeding: None.  Movement: Present. denies leaking of fluid. Review of Systems:   Pertinent items are noted in HPI Denies abnormal vaginal discharge w/ itching/odor/irritation, headaches, visual changes, shortness of breath, chest pain, abdominal pain, severe nausea/vomiting, or problems with urination or bowel movements unless otherwise stated above. Pertinent History Reviewed:  Reviewed past medical,surgical, social, obstetrical and family history.  Reviewed problem list, medications and allergies. Physical Assessment:   Vitals:   10/15/22 1358  BP: 106/70  Pulse: 96  Weight: 217 lb (98.4 kg)  Body mass index is 42.38 kg/m.        Physical Examination:   General appearance: Well appearing, and in no distress  Mental status: Alert, oriented to person, place, and time  Skin: Warm & dry  Cardiovascular: Normal heart rate noted  Respiratory: Normal respiratory effort, no distress  Abdomen: Soft, gravid, nontender  Pelvic: Cervical exam deferred         Extremities:    Fetal Status:     Movement: Present    Chaperone: n/a    No  results found for this or any previous visit (from the past 24 hour(s)).  Assessment & Plan:  1) Low-risk pregnancy G3P1011 at [redacted]w[redacted]d with an Estimated Date of Delivery: 01/09/23     ICD-10-CM   1. Supervision of other normal pregnancy, antepartum  Z34.80     2. Polyhydramnios, third trimester, mild  O40.3XX0    Dx today's sonogram     PN2 done earlier today   Meds: No orders of the defined types were placed in this encounter.  Labs/procedures today: [place]  Plan:  Continue routine obstetrical care  Next visit: prefers in person    Reviewed: Term labor symptoms and general obstetric precautions including but not limited to vaginal bleeding, contractions, leaking of fluid and fetal movement were reviewed in detail with the patient.  All questions were answered. Has home bp cuff. Rx faxed to . Check bp weekly, let us know if >140/90.   Follow-up: Return in about 3 weeks (around 11/05/2022) for LROB.  No orders of the defined types were placed in this encounter.   Lazaro Arms, MD 10/15/2022 2:50 PM

## 2022-10-15 NOTE — Progress Notes (Signed)
[place] 27+5 wks,cephalic,anterior placenta gr 0,FHR 144 bpm,polyhydramnios,AFI 28 cm,EFW 1309 g 82%

## 2022-10-16 ENCOUNTER — Telehealth: Payer: Self-pay | Admitting: *Deleted

## 2022-10-16 LAB — CBC
Hematocrit: 32.1 % — ABNORMAL LOW (ref 34.0–46.6)
Hemoglobin: 11 g/dL — ABNORMAL LOW (ref 11.1–15.9)
MCH: 31.6 pg (ref 26.6–33.0)
MCHC: 34.3 g/dL (ref 31.5–35.7)
MCV: 92 fL (ref 79–97)
Platelets: 245 x10E3/uL (ref 150–450)
RBC: 3.48 x10E6/uL — ABNORMAL LOW (ref 3.77–5.28)
RDW: 13 % (ref 11.7–15.4)
WBC: 9.8 x10E3/uL (ref 3.4–10.8)

## 2022-10-16 LAB — HIV ANTIBODY (ROUTINE TESTING W REFLEX): HIV Screen 4th Generation wRfx: NONREACTIVE

## 2022-10-16 LAB — GLUCOSE TOLERANCE, 2 HOURS W/ 1HR
Glucose, 1 hour: 111 mg/dL (ref 70–179)
Glucose, 2 hour: 113 mg/dL (ref 70–152)
Glucose, Fasting: 81 mg/dL (ref 70–91)

## 2022-10-16 LAB — SYPHILIS: RPR W/REFLEX TO RPR TITER AND TREPONEMAL ANTIBODIES, TRADITIONAL SCREENING AND DIAGNOSIS ALGORITHM: RPR Ser Ql: NONREACTIVE

## 2022-10-16 LAB — ANTIBODY SCREEN: Antibody Screen: NEGATIVE

## 2022-10-16 NOTE — Telephone Encounter (Signed)
Called patient. Concerned about extra fluid found on ultrasound yesterday.  Informed she will begin weekly ultrasounds starting at 32 weeks in which the fluid will be monitored. Plan of care will not change at this time.  Pt verbalized understanding with no further questions.

## 2022-10-17 ENCOUNTER — Emergency Department (HOSPITAL_COMMUNITY)
Admission: EM | Admit: 2022-10-17 | Discharge: 2022-10-17 | Disposition: A | Discharge status: Home / Self Care | Payer: Medicaid Other | Attending: Emergency Medicine | Admitting: Emergency Medicine

## 2022-10-17 ENCOUNTER — Other Ambulatory Visit: Payer: Self-pay

## 2022-10-17 ENCOUNTER — Emergency Department (HOSPITAL_COMMUNITY): Payer: Medicaid Other

## 2022-10-17 ENCOUNTER — Encounter (HOSPITAL_COMMUNITY): Payer: Self-pay | Admitting: Emergency Medicine

## 2022-10-17 DIAGNOSIS — Z3A28 28 weeks gestation of pregnancy: Secondary | ICD-10-CM | POA: Diagnosis not present

## 2022-10-17 DIAGNOSIS — Z3490 Encounter for supervision of normal pregnancy, unspecified, unspecified trimester: Secondary | ICD-10-CM

## 2022-10-17 DIAGNOSIS — R531 Weakness: Secondary | ICD-10-CM | POA: Insufficient documentation

## 2022-10-17 DIAGNOSIS — O26893 Other specified pregnancy related conditions, third trimester: Secondary | ICD-10-CM | POA: Insufficient documentation

## 2022-10-17 NOTE — Discharge Instructions (Signed)
[facility] with [facility] as scheduled.   Get seen next week if problems

## 2022-10-17 NOTE — ED Notes (Signed)
Called OB Rapid Response nurse. Provided patient information. Unable to pull patient monitor up remotely due to technical difficulties. Requested to fax a strip to their department at [phone].

## 2022-10-17 NOTE — ED Notes (Signed)
AVS provided to and discussed with patient and family member at bedside. Pt verbalizes understanding of discharge instructions and denies any questions or concerns at this time. Pt ambulated out of department independently with steady gait. ° °

## 2022-10-17 NOTE — ED Notes (Signed)
US at bedside

## 2022-10-17 NOTE — ED Triage Notes (Addendum)
Pt presents from home for decreased fetal movement x 2 days (last movement this AM but less active). She is G3, P1 and was recently told she has elevated levels of amniotic fluid.  Denies leakage of fluid or blood. Endorses RLQ abd pain (3/10)  Primary RN at bedside applying fetal motitor at this time

## 2022-10-17 NOTE — ED Provider Notes (Signed)
[place] EMERGENCY DEPARTMENT AT [facility] Provider Note   CSN: [id number] Arrival date & time: 10/17/22  6213     History  No chief complaint on file.   Valerie Greene is a 27 y.o. female.  Pt is [redacted] weeks pregnant and states she is not feeling the baby move.  Patient has no medical problem  The history is provided by the patient and medical records. No language interpreter was used.  Weakness Severity:  Mild Onset quality:  Gradual Timing:  Constant Progression:  Unchanged Chronicity:  Recurrent Relieved by:  Nothing Worsened by:  Nothing Ineffective treatments:  None tried Associated symptoms: no abdominal pain, no chest pain, no cough, no diarrhea, no frequency, no headaches and no seizures        Home Medications Prior to Admission medications   Medication Sig Start Date End Date Taking? Authorizing Provider  aspirin EC 81 MG tablet Take 1 tablet (81 mg total) by mouth daily. Swallow whole. 07/07/22   Cheral Marker, CNM  Blood Pressure Monitoring (BLOOD PRESSURE CUFF) MISC 1 Device by Does not apply route as directed. 07/07/22   Cheral Marker, CNM  prenatal vitamin w/FE, FA (PRENATAL 1 + 1) 27-1 MG TABS tablet Take 1 tablet by mouth daily 07/28/22   Cheral Marker, CNM      Allergies    Patient has no known allergies.    Review of Systems   Review of Systems  Constitutional:  Negative for appetite change and fatigue.  HENT:  Negative for congestion, ear discharge and sinus pressure.   Eyes:  Negative for discharge.  Respiratory:  Negative for cough.   Cardiovascular:  Negative for chest pain.  Gastrointestinal:  Negative for abdominal pain and diarrhea.  Genitourinary:  Negative for frequency and hematuria.  Musculoskeletal:  Negative for back pain.  Skin:  Negative for rash.  Neurological:  Positive for weakness. Negative for seizures and headaches.  Psychiatric/Behavioral:  Negative for hallucinations.     Physical Exam Updated  Vital Signs BP 100/61   Pulse 77   Temp 97.7 F (36.5 C) (Oral)   Resp 14   LMP 04/04/2022   SpO2 97%  Physical Exam Vitals and nursing note reviewed.  Constitutional:      Appearance: She is well-developed.  HENT:     Head: Normocephalic.     Nose: Nose normal.  Eyes:     General: No scleral icterus.    Conjunctiva/sclera: Conjunctivae normal.  Neck:     Thyroid: No thyromegaly.  Cardiovascular:     Rate and Rhythm: Normal rate and regular rhythm.     Heart sounds: No murmur heard.    No friction rub. No gallop.  Pulmonary:     Breath sounds: No stridor. No wheezing or rales.  Chest:     Chest wall: No tenderness.  Abdominal:     General: There is no distension.     Tenderness: There is no abdominal tenderness. There is no rebound.  Musculoskeletal:        General: Normal range of motion.     Cervical back: Neck supple.  Lymphadenopathy:     Cervical: No cervical adenopathy.  Skin:    Findings: No erythema or rash.  Neurological:     Mental Status: She is alert and oriented to person, place, and time.     Motor: No abnormal muscle tone.     Coordination: Coordination normal.  Psychiatric:  Behavior: Behavior normal.     ED Results / Procedures / Treatments   Labs (all labs ordered are listed, but only abnormal results are displayed) Labs Reviewed - No data to display  EKG None  Radiology US OB Follow Up  Result Date: 10/15/2022 Table formatting from the original result was not included. Images from the original result were not included.  ..an Financial trader of Ultrasound Medicine Technical sales engineer) accredited practice Center for [facility] [address] [place] [address] Ordering Provider: Jacklyn Shell, CNM FOLLOW UP SONOGRAM Valerie Greene is in the office for a follow up sonogram for EFW. She is a 27 y.o. year old G75P1011 with Estimated Date of Delivery: 01/09/23 by LMP now at  [redacted]w[redacted]d weeks gestation. Thus far  the pregnancy has been complicated by BMI 40-49,smoker . GESTATION: SINGLETON PRESENTATION: cephalic FETAL ACTIVITY:          Heart rate         144          The fetus is active. AMNIOTIC FLUID: The amniotic fluid volume is  abnormal - polyhydramnios , 28 cm. PLACENTA LOCALIZATION:  anterior GRADE 0 CERVIX: Limited view ADNEXA: The ovaries are normal. GESTATIONAL AGE AND  BIOMETRICS: Gestational criteria: Estimated Date of Delivery: 01/09/23 by LMP now at [redacted]w[redacted]d Previous Scans:4          BIPARIETAL DIAMETER           7.45 cm         29+6 weeks HEAD CIRCUMFERENCE           27.46 cm         30 weeks ABDOMINAL CIRCUMFERENCE           24.70 cm         29 weeks FEMUR LENGTH           5.36 cm         28+3 weeks                                                       AVERAGE EGA(BY THIS SCAN):  29 weeks                                                 ESTIMATED FETAL WEIGHT:       1309  grams, 82 % ANATOMICAL SURVEY                                                                            COMMENTS CEREBRAL VENTRICLES yes normal  CHOROID PLEXUS yes normal  CEREBELLUM yes normal  CISTERNA MAGNA  Yes  normal   CAVUM SEPTI PELLUCIDI YES NORMAL  NUCHAL REGION yes normal      NASAL BONE yes normal  NOSE/LIP yes normal  FACIAL PROFILE yes normal  4 CHAMBERED HEART yes normal  OUTFLOW TRACTS YES normaL  3VV YES  NORMAL  3VTV YES NORMAL  SITUS YES NORMAL      DIAPHRAGM yes normal  STOMACH yes normal  RENAL REGION yes normal  BLADDER yes normal          3 VESSEL CORD yes normal  SPINE yes normal          GENITALIA yes normal female     SUSPECTED ABNORMALITIES:  yes QUALITY OF SCAN: satisfactory TECHNICIAN COMMENTS: [place] 27+5 wks,cephalic,anterior placenta gr 0,FHR 144 bpm,polyhydramnios,AFI 28 cm,EFW 1309 g 82% A copy of this report including all images has been saved and backed up to a second source for retrieval if needed. All measures and details of the anatomical scan, placentation, fluid volume and pelvic anatomy are contained in that  report. Amber Flora Lipps 10/15/2022 2:07 PM  Clinical Impression and recommendations: I have reviewed the sonogram results above, combined with the patient's current clinical course, below are my impressions and any appropriate recommendations for management based on the sonographic findings. 1.  [id number] Estimated Date of Delivery: 01/09/23 by serial sonographic evaluations 2.  Fetal sonographic surveillance findings: a). Polyhydramnios, AFI 28 cm b). Normal growth percentile with appropriate interval growth:  82% 3.  Normal general sonographic findings Recommend continued prenatal evaluations and care based on this sonogram and as clinically indicated from the patient's clinical course. Lazaro Arms 10/15/2022 2:46 PM   Procedures Procedures    Medications Ordered in ED Medications - No data to display  ED Course/ Medical Decision Making/ A&P                             Medical Decision Making Amount and/or Complexity of Data Reviewed Radiology: ordered.   [place] of baby shows baby moving.  Fht normal.  [facility]        Final Clinical Impression(s) / ED Diagnoses Final diagnoses:  None    Rx / DC Orders ED Discharge Orders     None         Bethann Berkshire, MD 10/19/22 1630

## 2022-10-17 NOTE — Progress Notes (Signed)
G3P1 at 28 weeks reports to [facility] with c/o not feeling infant move x2 days.  Receives [facility] at [facility] in [place].  While she has been at [facility], patient reports good fetal movement. Unable to view [facility] due to technical issues system wide in [place] and throughout the area.  Talked to [facility] RN.  [facility] MD does not feel confident with current situation to monitor FHT's.  Will do bedside US to confirm wellness.  Will go to [facility] when the office opens to continue observation.  Keep scheduled PNV with [facility] this month.  Call the office with questions and concerns.  Marvell Fuller RNC-OB  [facility]  [phone]

## 2022-10-17 NOTE — ED Notes (Addendum)
Per MD, call [facility] back and ask if bedside US would be sufficient. Per [facility], perform bedside US with heart tones, have patient follow up with OB office when they open. MD aware.

## 2022-11-03 ENCOUNTER — Ambulatory Visit (INDEPENDENT_AMBULATORY_CARE_PROVIDER_SITE_OTHER): Payer: Medicaid Other | Admitting: Obstetrics & Gynecology

## 2022-11-03 ENCOUNTER — Encounter: Payer: Self-pay | Admitting: Obstetrics & Gynecology

## 2022-11-03 VITALS — BP 104/61 | HR 81 | Wt 220.2 lb

## 2022-11-03 DIAGNOSIS — O403XX0 Polyhydramnios, third trimester, not applicable or unspecified: Secondary | ICD-10-CM

## 2022-11-03 DIAGNOSIS — Z3A30 30 weeks gestation of pregnancy: Secondary | ICD-10-CM

## 2022-11-03 DIAGNOSIS — Z3480 Encounter for supervision of other normal pregnancy, unspecified trimester: Secondary | ICD-10-CM

## 2022-11-03 NOTE — Progress Notes (Signed)
HIGH-RISK PREGNANCY VISIT Patient name: Valerie Greene MRN [id number]  Date of birth: 1995/06/10 Chief Complaint:   Routine Prenatal Visit  History of Present Illness:   Valerie Greene is a 27 y.o. G68P1011 female at [redacted]w[redacted]d with an Estimated Date of Delivery: 01/09/23 being seen today for ongoing management of a high-risk pregnancy complicated by:  -Polyhydramnios -Tobacco use -Obesity  Today she reports no complaints.   Contractions: Not present. Vag. Bleeding: None.  Movement: Present. denies leaking of fluid.      07/07/2022   10:02 AM 07/22/2019   11:26 AM 09/09/2017    2:16 PM 06/04/2016    9:10 AM  Depression screen PHQ 2/9  Decreased Interest 0 0 0 0  Down, Depressed, Hopeless 0 0 0 0  PHQ - 2 Score 0 0 0 0  Altered sleeping 0 0    Tired, decreased energy 3 0    Change in appetite 0 0    Feeling bad or failure about yourself  0 0    Trouble concentrating 0 0    Moving slowly or fidgety/restless 0 0    Suicidal thoughts 0 0    PHQ-9 Score 3 0       Current Outpatient Medications  Medication Instructions   aspirin EC 81 mg, Oral, Daily, Swallow whole.   Blood Pressure Monitoring (BLOOD PRESSURE CUFF) MISC 1 Device, Does not apply, As directed   prenatal vitamin w/FE, FA (PRENATAL 1 + 1) 27-1 MG TABS tablet Take 1 tablet by mouth daily     Review of Systems:   Pertinent items are noted in HPI Denies abnormal vaginal discharge w/ itching/odor/irritation, headaches, visual changes, shortness of breath, chest pain, abdominal pain, severe nausea/vomiting, or problems with urination or bowel movements unless otherwise stated above. Pertinent History Reviewed:  Reviewed past medical,surgical, social, obstetrical and family history.  Reviewed problem list, medications and allergies. Physical Assessment:   Vitals:   11/03/22 1619  BP: 104/61  Pulse: 81  Weight: 220 lb 3.2 oz (99.9 kg)  Body mass index is 43 kg/m.           Physical Examination:   General  appearance: alert, well appearing, and in no distress  Mental status: normal mood, behavior, speech, dress, motor activity, and thought processes  Skin: warm & dry   Extremities:      Cardiovascular: normal heart rate noted  Respiratory: normal respiratory effort, no distress  Abdomen: gravid, soft, non-tender  Pelvic: Cervical exam deferred         Fetal Status: Fetal Heart Rate (bpm): 145 Fundal Height: 33 cm Movement: Present    Fetal Surveillance Testing today: doppler   Chaperone: N/A    No results found for this or any previous visit (from the past 24 hour(s)).   Assessment & Plan:  High-risk pregnancy: G3P1011 at [redacted]w[redacted]d with an Estimated Date of Delivery: 01/09/23   1) Polyhydramnios @ 28wks -growth scan scheduled next week -if still present, will need BPP weekly  2) Obesity 3) tobacco use   Routine care -discussed Tdap- declined today  Meds: No orders of the defined types were placed in this encounter.   Labs/procedures today: doppler  Treatment Plan:  as outlined above  Reviewed: Preterm labor symptoms and general obstetric precautions including but not limited to vaginal bleeding, contractions, leaking of fluid and fetal movement were reviewed in detail with the patient.  All questions were answered. Pt has home bp cuff. Check bp weekly, let us know if >  140/90.   Follow-up: Return in about 2 weeks (around 11/17/2022) for [facility] visit and weekly BPP (starting in 2wks) and growth ever 4wks.   Future Appointments  Date Time Provider Department Center  11/12/2022  8:30 AM [facility] [place] [facility] None  11/12/2022  9:30 AM Arabella Merles, CNM [facility]  12/10/2022  8:30 AM [facility] - [facility] [place] [facility] None  12/10/2022  9:30 AM Arabella Merles, CNM [facility]  12/16/2022  8:30 AM [facility] - [facility] [place] [facility] None  12/16/2022  9:30 AM Cheral Marker, CNM [facility]  12/19/2022  9:50 AM [facility] NURSE [facility]  12/23/2022  8:30 AM [facility] [place]  [facility] None  12/23/2022  9:30 AM Cheral Marker, CNM [facility]  12/26/2022  9:50 AM [facility] NURSE [facility]  12/30/2022  8:30 AM [facility] [place] [facility] None  01/02/2023  9:30 AM [facility] N[facility]SE [facility]  01/06/2023  8:30 AM [facility] [place] [facility] None  01/09/2023 11:10 AM [facility] NURSE [facility]    No orders of the defined types were placed in this encounter.   Myna Hidalgo, DO Attending Obstetrician & Gynecologist, [facility] for [employer], [facility]

## 2022-11-04 DIAGNOSIS — U071 COVID-19: Secondary | ICD-10-CM

## 2022-11-04 HISTORY — DX: COVID-19: U07.1

## 2022-11-05 ENCOUNTER — Encounter: Payer: Medicaid Other | Admitting: Obstetrics & Gynecology

## 2022-11-12 ENCOUNTER — Encounter: Payer: Medicaid Other | Admitting: Advanced Practice Midwife

## 2022-11-12 ENCOUNTER — Ambulatory Visit (INDEPENDENT_AMBULATORY_CARE_PROVIDER_SITE_OTHER): Payer: Medicaid Other

## 2022-11-12 ENCOUNTER — Ambulatory Visit: Payer: Medicaid Other | Admitting: Advanced Practice Midwife

## 2022-11-12 ENCOUNTER — Encounter: Payer: Self-pay | Admitting: Advanced Practice Midwife

## 2022-11-12 VITALS — BP 99/65 | HR 91 | Wt 218.0 lb

## 2022-11-12 DIAGNOSIS — Z3480 Encounter for supervision of other normal pregnancy, unspecified trimester: Secondary | ICD-10-CM

## 2022-11-12 DIAGNOSIS — Z3A31 31 weeks gestation of pregnancy: Secondary | ICD-10-CM

## 2022-11-12 DIAGNOSIS — O403XX0 Polyhydramnios, third trimester, not applicable or unspecified: Secondary | ICD-10-CM

## 2022-11-12 DIAGNOSIS — Z3483 Encounter for supervision of other normal pregnancy, third trimester: Secondary | ICD-10-CM

## 2022-11-12 DIAGNOSIS — O409XX0 Polyhydramnios, unspecified trimester, not applicable or unspecified: Secondary | ICD-10-CM | POA: Insufficient documentation

## 2022-11-12 NOTE — Progress Notes (Signed)
[place] 31+5 wks,cephalic,anterior placenta gr 2,polyhydramnios,AFI 26 cm,FHR 146 bpm,EFW 2032 g 71%

## 2022-11-12 NOTE — Patient Instructions (Signed)
Valerie Greene, thank you for choosing our office today! We appreciate the opportunity to meet your healthcare needs. You may receive a short survey by mail, e-mail, or through [employer]. If you are happy with your care we would appreciate if you could take just a few minutes to complete the survey questions. We read all of your comments and take your feedback very seriously. Thank you again for choosing our office.  Center for [employer] Team at [place] at [facility] ([place], [place] [address]) Entrance C, located off of [place] parking   CLASSES: Go to [email or web address] to register for classes (childbirth, breastfeeding, waterbirth, infant CPR, daddy bootcamp, etc.)  Call the office [phone]) or go to [facility] if: You begin to have strong, frequent contractions Your water breaks.  Sometimes it is a big gush of fluid, sometimes it is just a trickle that keeps getting your panties wet or running down your legs You have vaginal bleeding.  It is normal to have a small amount of spotting if your cervix was checked.  You don't feel your baby moving like normal.  If you don't, get you something to eat and drink and lay down and focus on feeling your baby move.   If your baby is still not moving like normal, you should call the office or go to [facility].  Call the office [phone]) or go to [facility] hospital for these signs of pre-eclampsia: Severe headache that does not go away with Tylenol Visual changes- seeing spots, double, blurred vision Pain under your right breast or upper abdomen that does not go away with Tums or heartburn medicine Nausea and/or vomiting Severe swelling in your hands, feet, and face   Tdap Vaccine It is recommended that you get the Tdap vaccine during the third trimester of EACH pregnancy to help protect your baby from getting pertussis (whooping cough) 27-36 weeks is the BEST time to do  this so that you can pass the protection on to your baby. During pregnancy is better than after pregnancy, but if you are unable to get it during pregnancy it will be offered at the hospital.  You can get this vaccine with [place], at the health department, your family doctor, or some local pharmacies Everyone who will be around your baby should also be up-to-date on their vaccines before the baby comes. Adults (who are not pregnant) only need 1 dose of Tdap during adulthood.   [facility] Pediatricians/Family Doctors [facility]): [address] [place], [facility]: [address] [place], [phone]                [facility]): [place], [phone] (call to ask if accepting patients) [facility] Department: [place] [address], [place], [place] Pediatricians/Family Doctors [facility]): [place] [place], [address], [facility]: [address] [place], [place] [facility] [place]: [place], [facility]): [phone] [facility]: [address] [place]: [address], [facility]: [phone], [phone]  Home Blood Pressure Monitoring for Patients   Your provider has recommended that you check your  blood pressure (BP) at least once a week at home. If you do not have a blood pressure cuff at home, one will be provided for you. Contact your provider if you have not received your monitor within 1 week.   Helpful Tips for Accurate Home Blood Pressure Checks  Don't smoke, exercise, or drink caffeine 30 minutes before checking your BP Use the restroom before checking your BP (a full bladder can raise your  pressure) Relax in a comfortable upright chair Feet on the ground Left arm resting comfortably on a flat surface at the level of your heart Legs uncrossed Back supported Sit quietly and don't talk Place the cuff on your bare arm Adjust snuggly, so that only two fingertips can fit between your skin and the top of the cuff Check 2 readings separated by at least one minute Keep a log of your BP readings For a visual, please reference this diagram: http://ccnc.care/bpdiagram  Provider Name: [facility]     Phone: [phone]  Zone 1: ALL CLEAR  Continue to monitor your symptoms:  BP reading is less than 140 (top number) or less than 90 (bottom number)  No right upper stomach pain No headaches or seeing spots No feeling nauseated or throwing up No swelling in face and hands  Zone 2: CAUTION Call your doctor's office for any of the following:  BP reading is greater than 140 (top number) or greater than 90 (bottom number)  Stomach pain under your ribs in the middle or right side Headaches or seeing spots Feeling nauseated or throwing up Swelling in face and hands  Zone 3: EMERGENCY  Seek immediate medical care if you have any of the following:  BP reading is greater than160 (top number) or greater than 110 (bottom number) Severe headaches not improving with Tylenol Serious difficulty catching your breath Any worsening symptoms from Zone 2   Third Trimester of Pregnancy The third trimester is from week 29 through week 42, months 7 through 9. The third trimester is a time when the fetus is growing rapidly. At the end of the ninth month, the fetus is about 20 inches in length and weighs 6-10 pounds.  BODY CHANGES Your body goes through many changes during pregnancy. The changes vary from woman to woman.  Your weight will continue to increase. You can expect to gain 25-35 pounds (11-16 kg) by the end of the pregnancy. You may begin to get stretch marks on your hips, abdomen,  and breasts. You may urinate more often because the fetus is moving lower into your pelvis and pressing on your bladder. You may develop or continue to have heartburn as a result of your pregnancy. You may develop constipation because certain hormones are causing the muscles that push waste through your intestines to slow down. You may develop hemorrhoids or swollen, bulging veins (varicose veins). You may have pelvic pain because of the weight gain and pregnancy hormones relaxing your joints between the bones in your pelvis. Backaches may result from overexertion of the muscles supporting your posture. You may have changes in your hair. These can include thickening of your hair, rapid growth, and changes in texture. Some women also have hair loss during or after pregnancy, or hair that feels dry or thin. Your hair will most likely return to normal after your baby is born. Your breasts will continue to grow and be tender. A yellow discharge may leak from your breasts called colostrum. Your belly button may stick out. You may   feel short of breath because of your expanding uterus. You may notice the fetus "dropping," or moving lower in your abdomen. You may have a bloody mucus discharge. This usually occurs a few days to a week before labor begins. Your cervix becomes thin and soft (effaced) near your due date. WHAT TO EXPECT AT YOUR PRENATAL EXAMS  You will have prenatal exams every 2 weeks until week 36. Then, you will have weekly prenatal exams. During a routine prenatal visit: You will be weighed to make sure you and the fetus are growing normally. Your blood pressure is taken. Your abdomen will be measured to track your baby's growth. The fetal heartbeat will be listened to. Any test results from the previous visit will be discussed. You may have a cervical check near your due date to see if you have effaced. At around 36 weeks, your caregiver will check your cervix. At the same time, your  caregiver will also perform a test on the secretions of the vaginal tissue. This test is to determine if a type of bacteria, Group B streptococcus, is present. Your caregiver will explain this further. Your caregiver may ask you: What your birth plan is. How you are feeling. If you are feeling the baby move. If you have had any abnormal symptoms, such as leaking fluid, bleeding, severe headaches, or abdominal cramping. If you have any questions. Other tests or screenings that may be performed during your third trimester include: Blood tests that check for low iron levels (anemia). Fetal testing to check the health, activity level, and growth of the fetus. Testing is done if you have certain medical conditions or if there are problems during the pregnancy. FALSE LABOR You may feel small, irregular contractions that eventually go away. These are called Braxton Hicks contractions, or false labor. Contractions may last for hours, days, or even weeks before true labor sets in. If contractions come at regular intervals, intensify, or become painful, it is best to be seen by your caregiver.  SIGNS OF LABOR  Menstrual-like cramps. Contractions that are 5 minutes apart or less. Contractions that start on the top of the uterus and spread down to the lower abdomen and back. A sense of increased pelvic pressure or back pain. A watery or bloody mucus discharge that comes from the vagina. If you have any of these signs before the 37th week of pregnancy, call your caregiver right away. You need to go to the hospital to get checked immediately. HOME CARE INSTRUCTIONS  Avoid all smoking, herbs, alcohol, and unprescribed drugs. These chemicals affect the formation and growth of the baby. Follow your caregiver's instructions regarding medicine use. There are medicines that are either safe or unsafe to take during pregnancy. Exercise only as directed by your caregiver. Experiencing uterine cramps is a good sign to  stop exercising. Continue to eat regular, healthy meals. Wear a good support bra for breast tenderness. Do not use hot tubs, steam rooms, or saunas. Wear your seat belt at all times when driving. Avoid raw meat, uncooked cheese, cat litter boxes, and soil used by cats. These carry germs that can cause birth defects in the baby. Take your prenatal vitamins. Try taking a stool softener (if your caregiver approves) if you develop constipation. Eat more high-fiber foods, such as fresh vegetables or fruit and whole grains. Drink plenty of fluids to keep your urine clear or pale yellow. Take warm sitz baths to soothe any pain or discomfort caused by hemorrhoids. Use hemorrhoid cream if   your caregiver approves. If you develop varicose veins, wear support hose. Elevate your feet for 15 minutes, 3-4 times a day. Limit salt in your diet. Avoid heavy lifting, wear low heal shoes, and practice good posture. Rest a lot with your legs elevated if you have leg cramps or low back pain. Visit your dentist if you have not gone during your pregnancy. Use a soft toothbrush to brush your teeth and be gentle when you floss. A sexual relationship may be continued unless your caregiver directs you otherwise. Do not travel far distances unless it is absolutely necessary and only with the approval of your caregiver. Take prenatal classes to understand, practice, and ask questions about the labor and delivery. Make a trial run to the hospital. Pack your hospital bag. Prepare the baby's nursery. Continue to go to all your prenatal visits as directed by your caregiver. SEEK MEDICAL CARE IF: You are unsure if you are in labor or if your water has broken. You have dizziness. You have mild pelvic cramps, pelvic pressure, or nagging pain in your abdominal area. You have persistent nausea, vomiting, or diarrhea. You have a bad smelling vaginal discharge. You have pain with urination. SEEK IMMEDIATE MEDICAL CARE IF:  You  have a fever. You are leaking fluid from your vagina. You have spotting or bleeding from your vagina. You have severe abdominal cramping or pain. You have rapid weight loss or gain. You have shortness of breath with chest pain. You notice sudden or extreme swelling of your face, hands, ankles, feet, or legs. You have not felt your baby move in over an hour. You have severe headaches that do not go away with medicine. You have vision changes. Document Released: 03/11/2001 Document Revised: 03/22/2013 Document Reviewed: 05/18/2012 ExitCare Patient Information 2015 [employer]. This information is not intended to replace advice given to you by your health care provider. Make sure you discuss any questions you have with your health care provider.       

## 2022-11-12 NOTE — Progress Notes (Signed)
HIGH-RISK PREGNANCY VISIT Patient name: Valerie Greene MRN [id number]  Date of birth: 04-23-1995 Chief Complaint:   Routine Prenatal Visit (Ultrasound today)  History of Present Illness:   Valerie Greene is a 27 y.o. G71P1011 female at [redacted]w[redacted]d with an Estimated Date of Delivery: 01/09/23 being seen today for ongoing management of a high-risk pregnancy complicated by polyhydramnios.    Today she reports no complaints. Contractions: Not present. Vag. Bleeding: None.  Movement: Present. denies leaking of fluid.      07/07/2022   10:02 AM 07/22/2019   11:26 AM 09/09/2017    2:16 PM 06/04/2016    9:10 AM  Depression screen PHQ 2/9  Decreased Interest 0 0 0 0  Down, Depressed, Hopeless 0 0 0 0  PHQ - 2 Score 0 0 0 0  Altered sleeping 0 0    Tired, decreased energy 3 0    Change in appetite 0 0    Feeling bad or failure about yourself  0 0    Trouble concentrating 0 0    Moving slowly or fidgety/restless 0 0    Suicidal thoughts 0 0    PHQ-9 Score 3 0          07/07/2022   10:02 AM 07/22/2019   11:26 AM  GAD 7 : Generalized Anxiety Score  Nervous, Anxious, on Edge 0 0  Control/stop worrying 0 0  Worry too much - different things 0 0  Trouble relaxing 0 0  Restless 0 0  Easily annoyed or irritable 0 0  Afraid - awful might happen 0 0  Total GAD 7 Score 0 0     Review of Systems:   Pertinent items are noted in HPI Denies abnormal vaginal discharge w/ itching/odor/irritation, headaches, visual changes, shortness of breath, chest pain, abdominal pain, severe nausea/vomiting, or problems with urination or bowel movements unless otherwise stated above. Pertinent History Reviewed:  Reviewed past medical,surgical, social, obstetrical and family history.  Reviewed problem list, medications and allergies. Physical Assessment:   Vitals:   11/12/22 0908  BP: 99/65  Pulse: 91  Weight: 218 lb (98.9 kg)  Body mass index is 42.58 kg/m.           Physical Examination:   General  appearance: alert, well appearing, and in no distress  Mental status: alert, oriented to person, place, and time  Skin: warm & dry   Extremities: Edema: None    Cardiovascular: normal heart rate noted  Respiratory: normal respiratory effort, no distress  Abdomen: gravid, soft, non-tender  Pelvic: Cervical exam deferred         Fetal Status: Fetal Heart Rate (bpm): 146 u/s   Movement: Present    Fetal Surveillance Testing today: [place] 31+5 wks,cephalic,anterior placenta gr 2,polyhydramnios,AFI 26 cm,FHR 146 bpm,EFW 2032 g 71%     No results found for this or any previous visit (from the past 24 hour(s)).  Assessment & Plan:  High-risk pregnancy: G3P1011 at [redacted]w[redacted]d with an Estimated Date of Delivery: 01/09/23   1) Mild polyhydramnios, AFI 26cm today, EFW 71st %   Meds: No orders of the defined types were placed in this encounter.   Labs/procedures today: U/S (declines Tdap today; may want later)  Treatment Plan:  weekly BPP, growth q 4wks; IOL 39-40wk  Reviewed: Preterm labor symptoms and general obstetric precautions including but not limited to vaginal bleeding, contractions, leaking of fluid and fetal movement were reviewed in detail with the patient.  All questions were answered. Does have  home bp cuff. Office bp cuff given: not applicable. Check bp weekly, let us know if consistently >140 and/or >90.  Follow-up: Return for As scheduled (make sure she has weekly BPPs with growth on 9/11 or 9/17 u/s).   Future Appointments  Date Time Provider Department Center  11/12/2022  9:30 AM Arabella Merles, CNM [facility]  11/18/2022  3:45 PM [facility] [place] [facility] None  11/18/2022  4:30 PM Lazaro Arms, MD [facility]  11/25/2022 11:30 AM [facility] [place] [facility] None  11/25/2022  1:30 PM Lazaro Arms, MD [facility]  12/02/2022  3:45 PM [facility] [place] [facility] None  12/02/2022  4:30 PM Lazaro Arms, MD [facility]  12/10/2022  8:30 AM [facility] [place] [facility] None   12/10/2022  9:30 AM Arabella Merles, CNM [facility]  12/16/2022  8:30 AM [facility] [place] [facility] None  12/16/2022  9:30 AM Cheral Marker, CNM [facility]  12/19/2022  9:50 AM [facility] NURSE [facility]  12/23/2022  8:30 AM [facility] [place] [facility] None  12/23/2022  9:30 AM Cheral Marker, CNM [facility]  12/26/2022  9:50 AM [facility] NURSE [facility]  12/30/2022  8:30 AM [facility] [place] [facility] None  12/30/2022  9:30 AM Lazaro Arms, MD [facility]  01/02/2023  9:30 AM [facility] NURSE [facility]  01/06/2023  8:30 AM [facility] [place] [facility] None  01/06/2023  9:30 AM Lazaro Arms, MD [facility]  01/09/2023 11:10 AM [facility] NURSE [facility]    No orders of the defined types were placed in this encounter.  Arabella Merles CNM 11/12/2022 9:23 AM

## 2022-11-17 ENCOUNTER — Other Ambulatory Visit: Payer: Self-pay | Admitting: Advanced Practice Midwife

## 2022-11-17 DIAGNOSIS — O409XX0 Polyhydramnios, unspecified trimester, not applicable or unspecified: Secondary | ICD-10-CM

## 2022-11-17 DIAGNOSIS — O99213 Obesity complicating pregnancy, third trimester: Secondary | ICD-10-CM

## 2022-11-18 ENCOUNTER — Ambulatory Visit (INDEPENDENT_AMBULATORY_CARE_PROVIDER_SITE_OTHER): Payer: Medicaid Other

## 2022-11-18 ENCOUNTER — Ambulatory Visit (INDEPENDENT_AMBULATORY_CARE_PROVIDER_SITE_OTHER): Payer: Medicaid Other | Admitting: Obstetrics & Gynecology

## 2022-11-18 VITALS — BP 112/71 | HR 94 | Wt 222.0 lb

## 2022-11-18 DIAGNOSIS — O0993 Supervision of high risk pregnancy, unspecified, third trimester: Secondary | ICD-10-CM

## 2022-11-18 DIAGNOSIS — O403XX0 Polyhydramnios, third trimester, not applicable or unspecified: Secondary | ICD-10-CM

## 2022-11-18 DIAGNOSIS — Z3A32 32 weeks gestation of pregnancy: Secondary | ICD-10-CM | POA: Diagnosis not present

## 2022-11-18 DIAGNOSIS — O409XX0 Polyhydramnios, unspecified trimester, not applicable or unspecified: Secondary | ICD-10-CM

## 2022-11-18 DIAGNOSIS — O99213 Obesity complicating pregnancy, third trimester: Secondary | ICD-10-CM

## 2022-11-18 DIAGNOSIS — O09899 Supervision of other high risk pregnancies, unspecified trimester: Secondary | ICD-10-CM

## 2022-11-18 NOTE — Progress Notes (Signed)
[place] 32+4 wks,cephalic,BPP 8/8,FHR 138 bpm,anterior placenta gr 2,mild polyhydramnios,AFI 26 cm

## 2022-11-18 NOTE — Progress Notes (Signed)
HIGH-RISK PREGNANCY VISIT Patient name: Valerie Greene MRN [id number]  Date of birth: 1995/10/07 Chief Complaint:   Routine Prenatal Visit  History of Present Illness:   Valerie Greene is a 27 y.o. G85P1011 female at [redacted]w[redacted]d with an Estimated Date of Delivery: 01/09/23 being seen today for ongoing management of a high-risk pregnancy complicated by mild polyhydramnios.    Today she reports no complaints. Contractions: Not present. Vag. Bleeding: None.  Movement: Present. denies leaking of fluid.      07/07/2022   10:02 AM 07/22/2019   11:26 AM 09/09/2017    2:16 PM 06/04/2016    9:10 AM  Depression screen PHQ 2/9  Decreased Interest 0 0 0 0  Down, Depressed, Hopeless 0 0 0 0  PHQ - 2 Score 0 0 0 0  Altered sleeping 0 0    Tired, decreased energy 3 0    Change in appetite 0 0    Feeling bad or failure about yourself  0 0    Trouble concentrating 0 0    Moving slowly or fidgety/restless 0 0    Suicidal thoughts 0 0    PHQ-9 Score 3 0          07/07/2022   10:02 AM 07/22/2019   11:26 AM  GAD 7 : Generalized Anxiety Score  Nervous, Anxious, on Edge 0 0  Control/stop worrying 0 0  Worry too much - different things 0 0  Trouble relaxing 0 0  Restless 0 0  Easily annoyed or irritable 0 0  Afraid - awful might happen 0 0  Total GAD 7 Score 0 0     Review of Systems:   Pertinent items are noted in HPI Denies abnormal vaginal discharge w/ itching/odor/irritation, headaches, visual changes, shortness of breath, chest pain, abdominal pain, severe nausea/vomiting, or problems with urination or bowel movements unless otherwise stated above. Pertinent History Reviewed:  Reviewed past medical,surgical, social, obstetrical and family history.  Reviewed problem list, medications and allergies. Physical Assessment:   Vitals:   11/18/22 1627  BP: 112/71  Pulse: 94  Weight: 222 lb (100.7 kg)  Body mass index is 43.36 kg/m.           Physical Examination:   General appearance:  alert, well appearing, and in no distress  Mental status: alert, oriented to person, place, and time  Skin: warm & dry   Extremities:      Cardiovascular: normal heart rate noted  Respiratory: normal respiratory effort, no distress  Abdomen: gravid, soft, non-tender  Pelvic: Cervical exam deferred         Fetal Status:     Movement: Present    Fetal Surveillance Testing today: BPP 8/8, AFI 26 cm   Chaperone: N/A    No results found for this or any previous visit (from the past 24 hour(s)).  Assessment & Plan:  High-risk pregnancy: G3P1011 at [redacted]w[redacted]d with an Estimated Date of Delivery: 01/09/23      ICD-10-CM   1. Supervision of high risk pregnancy in third trimester  O09.93     2. Polyhydramnios in third trimester complication, mild, 26 cm  O40.3XX0          Meds: No orders of the defined types were placed in this encounter.   Orders: No orders of the defined types were placed in this encounter.    Labs/procedures today: U/S  Treatment Plan:  weekly surveillance with AFI    Follow-up: No follow-ups on file.  Future Appointments  Date Time Provider Department Center  11/25/2022 11:30 AM [facility] - [facility] [place] [facility] None  11/25/2022  1:30 PM Lazaro Arms, MD [facility]  12/02/2022  3:45 PM [facility] [place] [facility] None  12/02/2022  4:30 PM Lazaro Arms, MD [facility]  12/10/2022  8:30 AM [facility] [place] [facility] None  12/10/2022  9:30 AM Arabella Merles, CNM [facility]  12/16/2022  8:30 AM [facility] [place] [facility] None  12/16/2022  9:30 AM Cheral Marker, CNM [facility]  12/19/2022  9:50 AM [facility] NURSE [facility]  12/23/2022  8:30 AM [facility] [place] [facility] None  12/23/2022  9:30 AM Cheral Marker, CNM [facility]  12/26/2022  9:50 AM [facility] N[facility]SE [facility]  12/30/2022  8:30 AM [facility] [place] [facility] None  12/30/2022  9:30 AM Lazaro Arms, MD [facility]  01/02/2023  9:30 AM [facility] N[facility]   01/06/2023  8:30 AM [facility] [place] [facility] None  01/06/2023  9:30 AM Lazaro Arms, MD [facility]  01/09/2023 11:10 AM [facility] N[facility]SE [facility]    No orders of the defined types were placed in this encounter.  Lazaro Arms  Attending Physician for the Center for [facility] 11/18/2022 4:55 PM

## 2022-11-24 ENCOUNTER — Encounter: Payer: Self-pay | Admitting: Obstetrics & Gynecology

## 2022-11-24 ENCOUNTER — Telehealth: Payer: Self-pay | Admitting: *Deleted

## 2022-11-24 ENCOUNTER — Other Ambulatory Visit: Payer: Self-pay | Admitting: Obstetrics & Gynecology

## 2022-11-24 DIAGNOSIS — O409XX0 Polyhydramnios, unspecified trimester, not applicable or unspecified: Secondary | ICD-10-CM

## 2022-11-24 DIAGNOSIS — O99213 Obesity complicating pregnancy, third trimester: Secondary | ICD-10-CM

## 2022-11-24 NOTE — Telephone Encounter (Signed)
Patient states she noticed some very light spotting when using the bathroom about 2 hours ago.  Does not note any blood in underwear and denies recent intercourse, lifting/pulling, etc.  States baby is active and is not having any cramping. Advised to monitor since it was only when using the bathroom and was very light but to let us know if bleeding increases in amount or develops vaginal pressure, cramping, etc.  Pt verbalized understanding with no further questions and agreeable to plan.

## 2022-11-25 ENCOUNTER — Ambulatory Visit (INDEPENDENT_AMBULATORY_CARE_PROVIDER_SITE_OTHER): Payer: Medicaid Other

## 2022-11-25 ENCOUNTER — Ambulatory Visit: Payer: Medicaid Other | Admitting: Obstetrics & Gynecology

## 2022-11-25 VITALS — BP 107/70 | HR 87 | Wt 216.0 lb

## 2022-11-25 DIAGNOSIS — Z3A33 33 weeks gestation of pregnancy: Secondary | ICD-10-CM | POA: Diagnosis not present

## 2022-11-25 DIAGNOSIS — O99213 Obesity complicating pregnancy, third trimester: Secondary | ICD-10-CM

## 2022-11-25 DIAGNOSIS — O403XX0 Polyhydramnios, third trimester, not applicable or unspecified: Secondary | ICD-10-CM | POA: Diagnosis not present

## 2022-11-25 DIAGNOSIS — Z3A34 34 weeks gestation of pregnancy: Secondary | ICD-10-CM

## 2022-11-25 DIAGNOSIS — O0993 Supervision of high risk pregnancy, unspecified, third trimester: Secondary | ICD-10-CM

## 2022-11-25 DIAGNOSIS — O09899 Supervision of other high risk pregnancies, unspecified trimester: Secondary | ICD-10-CM

## 2022-11-25 DIAGNOSIS — O409XX0 Polyhydramnios, unspecified trimester, not applicable or unspecified: Secondary | ICD-10-CM

## 2022-11-25 NOTE — Progress Notes (Signed)
[place] 33+4 wks,cephalic,BPP 8/8,anterior placenta gr 2,AFI 25 cm,mild polyhydramnios,FHR 144 bpm

## 2022-11-28 ENCOUNTER — Other Ambulatory Visit: Payer: Self-pay | Admitting: Obstetrics & Gynecology

## 2022-11-28 DIAGNOSIS — O409XX0 Polyhydramnios, unspecified trimester, not applicable or unspecified: Secondary | ICD-10-CM

## 2022-11-28 DIAGNOSIS — O99213 Obesity complicating pregnancy, third trimester: Secondary | ICD-10-CM

## 2022-12-01 NOTE — Progress Notes (Signed)
HIGH-RISK PREGNANCY VISIT Patient name: Valerie Greene MRN [id number]  Date of birth: [birth year] Chief Complaint:   Routine Prenatal Visit ([place] today)  History of Present Illness:   Valerie Greene is a 27 y.o. G46P1011 female at [redacted]w[redacted]d with an Estimated Date of Delivery: 01/09/23 being seen today for ongoing management of a high-risk pregnancy complicated by mild polyhydramnios.    Today she reports no complaints. Contractions: Not present. Vag. Bleeding: None.  Movement: Present. denies leaking of fluid.      07/07/2022   10:02 AM 07/22/2019   11:26 AM 09/09/2017    2:16 PM 06/04/2016    9:10 AM  Depression screen PHQ 2/9  Decreased Interest 0 0 0 0  Down, Depressed, Hopeless 0 0 0 0  PHQ - 2 Score 0 0 0 0  Altered sleeping 0 0    Tired, decreased energy 3 0    Change in appetite 0 0    Feeling bad or failure about yourself  0 0    Trouble concentrating 0 0    Moving slowly or fidgety/restless 0 0    Suicidal thoughts 0 0    PHQ-9 Score 3 0          07/07/2022   10:02 AM 07/22/2019   11:26 AM  GAD 7 : Generalized Anxiety Score  Nervous, Anxious, on Edge 0 0  Control/stop worrying 0 0  Worry too much - different things 0 0  Trouble relaxing 0 0  Restless 0 0  Easily annoyed or irritable 0 0  Afraid - awful might happen 0 0  Total GAD 7 Score 0 0     Review of Systems:   Pertinent items are noted in HPI Denies abnormal vaginal discharge w/ itching/odor/irritation, headaches, visual changes, shortness of breath, chest pain, abdominal pain, severe nausea/vomiting, or problems with urination or bowel movements unless otherwise stated above. Pertinent History Reviewed:  Reviewed past medical,surgical, social, obstetrical and family history.  Reviewed problem list, medications and allergies. Physical Assessment:   Vitals:   11/25/22 1212  BP: 107/70  Pulse: 87  Weight: 216 lb (98 kg)  Body mass index is 42.18 kg/m.           Physical Examination:   General  appearance: alert, well appearing, and in no distress  Mental status: alert, oriented to person, place, and time  Skin: warm & dry   Extremities:      Cardiovascular: normal heart rate noted  Respiratory: normal respiratory effort, no distress  Abdomen: gravid, soft, non-tender  Pelvic: Cervical exam deferred         Fetal Status:     Movement: Present    Fetal Surveillance Testing today: BPP 8/8 AFI 26 cm   Chaperone:     No results found for this or any previous visit (from the past 24 hour(s)).  Assessment & Plan:  High-risk pregnancy: G3P1011 at [redacted]w[redacted]d with an Estimated Date of Delivery: 01/09/23      ICD-10-CM   1. Supervision of high risk pregnancy in third trimester  O09.93     2. Polyhydramnios in third trimester complication, mild, 26 cm  O40.3XX0         Meds: No orders of the defined types were placed in this encounter.   Orders: No orders of the defined types were placed in this encounter.    Labs/procedures today: U/S  Treatment Plan:  keep scheduled    Follow-up: No follow-ups on file.   Future  Appointments  Date Time Provider Department Center  12/02/2022  3:45 PM [facility] - FTOBGYN [place] [facility] None  12/02/2022  4:30 PM Lazaro Arms, MD [facility]YN  12/10/2022  8:30 AM [facility] - [facility]YN [place] [facility] None  12/10/2022  9:30 AM Arabella Merles, CNM [facility]  12/16/2022  8:30 AM [facility] - [facility]YN [place] [facility] None  12/16/2022  9:30 AM Cheral Marker, CNM [facility]TO[facility]YN  12/19/2022  9:50 AM [facility] NURSE [facility]YN  12/23/2022  8:30 AM [facility] - [facility]YN [place] [facility] None  12/23/2022  9:30 AM Cheral Marker, CNM [facility]YN  12/26/2022  9:50 AM [facility]GYN NURSE [facility]TO[facility]YN  12/30/2022  8:30 AM [facility] - [facility]YN [place] [facility] None  12/30/2022  9:30 AM Lazaro Arms, MD [facility]TO[facility]YN  01/02/2023  9:30 AM [facility] NURSE [facility]YN  01/06/2023  8:30 AM [facility] - [facility]TO[facility]YN [place] [facility] None  01/06/2023  9:30 AM Lazaro Arms, MD [facility]TO[facility]   01/09/2023 11:10 AM [facility]FTOBGY[facility] NURSE [facility]TO[facility]YN    No orders of the defined types were placed in this encounter.  Lazaro Arms  Attending Physician for the Center for [facility] 12/01/2022 8:17 PM

## 2022-12-02 ENCOUNTER — Ambulatory Visit (INDEPENDENT_AMBULATORY_CARE_PROVIDER_SITE_OTHER): Payer: Medicaid Other | Admitting: Obstetrics & Gynecology

## 2022-12-02 ENCOUNTER — Encounter: Payer: Self-pay | Admitting: Obstetrics & Gynecology

## 2022-12-02 ENCOUNTER — Ambulatory Visit (INDEPENDENT_AMBULATORY_CARE_PROVIDER_SITE_OTHER): Payer: Medicaid Other

## 2022-12-02 VITALS — BP 119/78 | HR 103 | Wt 218.0 lb

## 2022-12-02 DIAGNOSIS — Z3A34 34 weeks gestation of pregnancy: Secondary | ICD-10-CM

## 2022-12-02 DIAGNOSIS — O403XX0 Polyhydramnios, third trimester, not applicable or unspecified: Secondary | ICD-10-CM

## 2022-12-02 DIAGNOSIS — O09899 Supervision of other high risk pregnancies, unspecified trimester: Secondary | ICD-10-CM

## 2022-12-02 DIAGNOSIS — O409XX0 Polyhydramnios, unspecified trimester, not applicable or unspecified: Secondary | ICD-10-CM

## 2022-12-02 DIAGNOSIS — O0993 Supervision of high risk pregnancy, unspecified, third trimester: Secondary | ICD-10-CM

## 2022-12-02 DIAGNOSIS — O99213 Obesity complicating pregnancy, third trimester: Secondary | ICD-10-CM | POA: Diagnosis not present

## 2022-12-02 NOTE — Progress Notes (Signed)
HIGH-RISK PREGNANCY VISIT Patient name: Valerie Greene MRN [id number]  Date of birth: 06/27/1995 Chief Complaint:   Routine Prenatal Visit (Ultrasound today)  History of Present Illness:   Valerie Greene is a 27 y.o. G70P1011 female at [redacted]w[redacted]d with an Estimated Date of Delivery: 01/09/23 being seen today for ongoing management of a high-risk pregnancy complicated by polyhydramnios, 26 cm   Today she reports no complaints. Contractions: Not present. Vag. Bleeding: None.  Movement: Present. denies leaking of fluid.      07/07/2022   10:02 AM 07/22/2019   11:26 AM 09/09/2017    2:16 PM 06/04/2016    9:10 AM  Depression screen PHQ 2/9  Decreased Interest 0 0 0 0  Down, Depressed, Hopeless 0 0 0 0  PHQ - 2 Score 0 0 0 0  Altered sleeping 0 0    Tired, decreased energy 3 0    Change in appetite 0 0    Feeling bad or failure about yourself  0 0    Trouble concentrating 0 0    Moving slowly or fidgety/restless 0 0    Suicidal thoughts 0 0    PHQ-9 Score 3 0          07/07/2022   10:02 AM 07/22/2019   11:26 AM  GAD 7 : Generalized Anxiety Score  Nervous, Anxious, on Edge 0 0  Control/stop worrying 0 0  Worry too much - different things 0 0  Trouble relaxing 0 0  Restless 0 0  Easily annoyed or irritable 0 0  Afraid - awful might happen 0 0  Total GAD 7 Score 0 0     Review of Systems:   Pertinent items are noted in HPI Denies abnormal vaginal discharge w/ itching/odor/irritation, headaches, visual changes, shortness of breath, chest pain, abdominal pain, severe nausea/vomiting, or problems with urination or bowel movements unless otherwise stated above. Pertinent History Reviewed:  Reviewed past medical,surgical, social, obstetrical and family history.  Reviewed problem list, medications and allergies. Physical Assessment:   Vitals:   12/02/22 1641  BP: 119/78  Pulse: (!) 103  Weight: 218 lb (98.9 kg)  Body mass index is 42.58 kg/m.           Physical Examination:    General appearance: alert, well appearing, and in no distress  Mental status: alert, oriented to person, place, and time  Skin: warm & dry   Extremities: Edema: None    Cardiovascular: normal heart rate noted  Respiratory: normal respiratory effort, no distress  Abdomen: gravid, soft, non-tender  Pelvic: Cervical exam deferred         Fetal Status:     Movement: Present    Fetal Surveillance Testing today: BPP 8/8 AFI 26 cm   Chaperone: N/A    No results found for this or any previous visit (from the past 24 hour(s)).  Assessment & Plan:  High-risk pregnancy: G3P1011 at [redacted]w[redacted]d with an Estimated Date of Delivery: 01/09/23      ICD-10-CM   1. Supervision of high risk pregnancy in third trimester  O09.93     2. Polyhydramnios in third trimester complication, mild, 26 cm  O40.3XX0          Meds: No orders of the defined types were placed in this encounter.   Orders: No orders of the defined types were placed in this encounter.    Labs/procedures today: U/S  Treatment Plan:  keep scheduled    Follow-up: No follow-ups on file.  Future Appointments  Date Time Provider Department Center  12/10/2022  8:30 AM [facility] - FTOBGYN [place] [facility] None  12/10/2022  9:30 AM Arabella Merles, CNM [facility] FTOBGYN  12/16/2022  8:30 AM [facility] - [facility]YN [place] [facility] None  12/16/2022  9:30 AM Cheral Marker, CNM [facility] FTOBGYN  12/19/2022  9:50 AM [facility]FTOBGYN NURSE [facility] FTOBGYN  12/23/2022  8:30 AM [facility] - FTO[facility]YN [place] [facility] None  12/23/2022  9:30 AM Cheral Marker, CNM [facility] FTOBGYN  12/26/2022  9:50 AM [facility]FTOBGYN NURSE [facility] FTOBGYN  12/30/2022  8:30 AM [facility] - [facility]TO[facility]YN [place] [facility] None  12/30/2022  9:30 AM Lazaro Arms, MD [facility] FTOBGYN  01/02/2023  9:30 AM [facility]FTOBGYN NURSE [facility]TOBGYN  01/06/2023  8:30 AM [facility] - [facility]YN [place] [facility] None  01/06/2023  9:30 AM Lazaro Arms, MD [facility]  01/09/2023 11:10 AM [facility]FT[facility]GY[facility] NURSE [facility]TO[facility]YN    No orders of the defined  types were placed in this encounter.  Lazaro Arms  Attending Physician for the Center for [facility] 12/02/2022 4:56 PM

## 2022-12-02 NOTE — Progress Notes (Signed)
[place] 34+4 wks,cephalic,BPP 8/8,AFI 26 cm,polyhydramnios,FHR 142 bpm,anterior placenta gr 2

## 2022-12-09 ENCOUNTER — Other Ambulatory Visit: Payer: Self-pay | Admitting: Obstetrics & Gynecology

## 2022-12-09 DIAGNOSIS — O99213 Obesity complicating pregnancy, third trimester: Secondary | ICD-10-CM

## 2022-12-09 DIAGNOSIS — O409XX0 Polyhydramnios, unspecified trimester, not applicable or unspecified: Secondary | ICD-10-CM

## 2022-12-10 ENCOUNTER — Ambulatory Visit (INDEPENDENT_AMBULATORY_CARE_PROVIDER_SITE_OTHER): Payer: Medicaid Other | Admitting: Advanced Practice Midwife

## 2022-12-10 ENCOUNTER — Encounter: Payer: Self-pay | Admitting: Advanced Practice Midwife

## 2022-12-10 ENCOUNTER — Ambulatory Visit (INDEPENDENT_AMBULATORY_CARE_PROVIDER_SITE_OTHER): Payer: Medicaid Other

## 2022-12-10 VITALS — BP 107/69 | HR 81 | Wt 217.0 lb

## 2022-12-10 DIAGNOSIS — O09899 Supervision of other high risk pregnancies, unspecified trimester: Secondary | ICD-10-CM

## 2022-12-10 DIAGNOSIS — O99213 Obesity complicating pregnancy, third trimester: Secondary | ICD-10-CM | POA: Diagnosis not present

## 2022-12-10 DIAGNOSIS — O403XX0 Polyhydramnios, third trimester, not applicable or unspecified: Secondary | ICD-10-CM | POA: Diagnosis not present

## 2022-12-10 DIAGNOSIS — Z3483 Encounter for supervision of other normal pregnancy, third trimester: Secondary | ICD-10-CM

## 2022-12-10 DIAGNOSIS — Z3A35 35 weeks gestation of pregnancy: Secondary | ICD-10-CM

## 2022-12-10 DIAGNOSIS — Z3480 Encounter for supervision of other normal pregnancy, unspecified trimester: Secondary | ICD-10-CM

## 2022-12-10 DIAGNOSIS — O09893 Supervision of other high risk pregnancies, third trimester: Secondary | ICD-10-CM

## 2022-12-10 DIAGNOSIS — O409XX0 Polyhydramnios, unspecified trimester, not applicable or unspecified: Secondary | ICD-10-CM

## 2022-12-10 NOTE — Progress Notes (Signed)
[place] 35+5 wks,cephalic,anterior placenta gr 3,FHR 157 bpm,AFI 25 cm,mild polyhydramnios,EFW 3036 g 78%

## 2022-12-10 NOTE — Progress Notes (Signed)
HIGH-RISK PREGNANCY VISIT Patient name: Valerie Greene MRN [id number]  Date of birth: 05-29-1995 Chief Complaint:   Routine Prenatal Visit and Pregnancy Ultrasound (Not going take tdap shot)  History of Present Illness:   Valerie Greene is a 27 y.o. G2P1011 female at [redacted]w[redacted]d with an Estimated Date of Delivery: 01/09/23 being seen today for ongoing management of a high-risk pregnancy complicated by polyhydramnios (mild, 25cm).    Today she reports no complaints. Contractions: Not present.  .  Movement: Present. denies leaking of fluid.      07/07/2022   10:02 AM 07/22/2019   11:26 AM 09/09/2017    2:16 PM 06/04/2016    9:10 AM  Depression screen PHQ 2/9  Decreased Interest 0 0 0 0  Down, Depressed, Hopeless 0 0 0 0  PHQ - 2 Score 0 0 0 0  Altered sleeping 0 0    Tired, decreased energy 3 0    Change in appetite 0 0    Feeling bad or failure about yourself  0 0    Trouble concentrating 0 0    Moving slowly or fidgety/restless 0 0    Suicidal thoughts 0 0    PHQ-9 Score 3 0          07/07/2022   10:02 AM 07/22/2019   11:26 AM  GAD 7 : Generalized Anxiety Score  Nervous, Anxious, on Edge 0 0  Control/stop worrying 0 0  Worry too much - different things 0 0  Trouble relaxing 0 0  Restless 0 0  Easily annoyed or irritable 0 0  Afraid - awful might happen 0 0  Total GAD 7 Score 0 0     Review of Systems:   Pertinent items are noted in HPI Denies abnormal vaginal discharge w/ itching/odor/irritation, headaches, visual changes, shortness of breath, chest pain, abdominal pain, severe nausea/vomiting, or problems with urination or bowel movements unless otherwise stated above. Pertinent History Reviewed:  Reviewed past medical,surgical, social, obstetrical and family history.  Reviewed problem list, medications and allergies. Physical Assessment:   Vitals:   12/10/22 0913  BP: 107/69  Pulse: 81  Weight: 217 lb (98.4 kg)  Body mass index is 42.38 kg/m.           Physical  Examination:   General appearance: alert, well appearing, and in no distress  Mental status: alert, oriented to person, place, and time  Skin: warm & dry   Extremities: Edema: None    Cardiovascular: normal heart rate noted  Respiratory: normal respiratory effort, no distress  Abdomen: gravid, soft, non-tender  Pelvic: Cervical exam deferred         Fetal Status: Fetal Heart Rate (bpm): 157 u/s   Movement: Present    Fetal Surveillance Testing today: [place] 35+5 wks,cephalic,anterior placenta gr 3,FHR 157 bpm,AFI 25 cm,mild polyhydramnios,EFW 3036 g 78%    No results found for this or any previous visit (from the past 24 hour(s)).  Assessment & Plan:  High-risk pregnancy: G3P1011 at [redacted]w[redacted]d with an Estimated Date of Delivery: 01/09/23   1) Mild polyhydramnios, stable at 25cm, EFW 78%   Meds: No orders of the defined types were placed in this encounter.   Labs/procedures today: U/S  Treatment Plan:  continue wkly BPP with IOL 39-40wks; GBS/cultures at next visit  Reviewed: Preterm labor symptoms and general obstetric precautions including but not limited to vaginal bleeding, contractions, leaking of fluid and fetal movement were reviewed in detail with the patient.  All questions were  answered. Does have home bp cuff. Office bp cuff given: not applicable. Check bp daily, let us know if consistently >140 and/or >90.  Follow-up: Return for As scheduled.   Future Appointments  Date Time Provider Department Center  12/16/2022  8:30 AM [facility] - FTOBGYN [place] [facility] None  12/16/2022  9:30 AM Cheral Marker, CNM [facility]FT FTOBGYN  12/19/2022  9:50 AM [facility]FTOBGYN NURSE [facility]TO[facility]YN  12/23/2022  8:30 AM [facility] - [facility]YN [place] [facility] None  12/23/2022  9:30 AM Cheral Marker, CNM [facility] FTO[facility]YN  12/26/2022  9:50 AM [facility]FTOBGYN NURSE [facility] FTOBGYN  12/30/2022  8:30 AM [facility] - [facility]YN [place] [facility] None  12/30/2022  9:30 AM Lazaro Arms, MD [facility] FTOBGYN  01/02/2023  9:30 AM [facility]FTOBGYN NURSE [facility]-FT  FTOBGYN  01/06/2023  8:30 AM [facility] - [facility]TO[facility]YN [place] [facility] None  01/06/2023  9:30 AM Lazaro Arms, MD [facility] FTO[facility]YN  01/09/2023 11:10 AM [facility]FT[facility]GY[facility] NURSE [facility]TO[facility]YN    No orders of the defined types were placed in this encounter.  Arabella Merles CNM 12/10/2022 10:08 AM

## 2022-12-12 ENCOUNTER — Other Ambulatory Visit: Payer: Self-pay

## 2022-12-12 ENCOUNTER — Inpatient Hospital Stay (HOSPITAL_COMMUNITY)
Admission: AD | Admit: 2022-12-12 | Discharge: 2022-12-12 | Disposition: A | Discharge status: Home / Self Care | Payer: Medicaid Other | Attending: Obstetrics and Gynecology | Admitting: Obstetrics and Gynecology

## 2022-12-12 ENCOUNTER — Encounter (HOSPITAL_COMMUNITY): Payer: Self-pay | Admitting: Obstetrics and Gynecology

## 2022-12-12 DIAGNOSIS — O26893 Other specified pregnancy related conditions, third trimester: Secondary | ICD-10-CM | POA: Diagnosis present

## 2022-12-12 DIAGNOSIS — Z3A36 36 weeks gestation of pregnancy: Secondary | ICD-10-CM | POA: Insufficient documentation

## 2022-12-12 DIAGNOSIS — O2343 Unspecified infection of urinary tract in pregnancy, third trimester: Secondary | ICD-10-CM | POA: Insufficient documentation

## 2022-12-12 DIAGNOSIS — R311 Benign essential microscopic hematuria: Secondary | ICD-10-CM | POA: Insufficient documentation

## 2022-12-12 DIAGNOSIS — F17210 Nicotine dependence, cigarettes, uncomplicated: Secondary | ICD-10-CM | POA: Diagnosis not present

## 2022-12-12 DIAGNOSIS — O99333 Smoking (tobacco) complicating pregnancy, third trimester: Secondary | ICD-10-CM | POA: Insufficient documentation

## 2022-12-12 DIAGNOSIS — Z8616 Personal history of COVID-19: Secondary | ICD-10-CM | POA: Diagnosis not present

## 2022-12-12 LAB — URINALYSIS, ROUTINE W REFLEX MICROSCOPIC
Glucose, UA: NEGATIVE mg/dL
Ketones, ur: NEGATIVE mg/dL
Nitrite: NEGATIVE
Protein, ur: NEGATIVE mg/dL
Specific Gravity, Urine: 1.02 (ref 1.005–1.030)
pH: 7 (ref 5.0–8.0)

## 2022-12-12 LAB — URINALYSIS, MICROSCOPIC (REFLEX): RBC / HPF: >50 RBC/hpf (ref 0–5)

## 2022-12-12 MED ORDER — FOSFOMYCIN TROMETHAMINE 3 G PO PACK
3.0000 g | PACK | Freq: Once | ORAL | 0 refills | Status: AC
Start: 2022-12-12 — End: 2022-12-12

## 2022-12-12 NOTE — MAU Provider Note (Addendum)
History     CSN: [id number]  Arrival date and time: 12/12/22 1442   Event Date/Time   First Provider Initiated Contact with Patient 12/12/22 1611      Chief Complaint  Patient presents with   Hematuria   HPI Ms. Valerie Greene is a 27 y.o. year old G29P1011 female at [redacted]w[redacted]d weeks gestation who presents to MAU reporting blood in urine this morning. She denies urinary symptoms. She does endorse frequency. She denies pain. She receives [facility] with [facility]; next appt is 12/16/2022.    OB History     Gravida  3   Para  1   Term  1   Preterm      AB  1   Living  1      SAB      IAB  1   Ectopic      Multiple      Live Births  1           Past Medical History:  Diagnosis Date   Chlamydia 06/11/2016   COVID 11/04/2022   Kidney stone    Shingles 2018   Vaginal discharge 12/21/2013    History reviewed. No pertinent surgical history.  Family History  Problem Relation Age of Onset   Cancer Maternal Grandmother        breast, lung   Cancer Maternal Grandfather        pancreatic    Social History   Tobacco Use   Smoking status: Every Day    Current packs/day: 0.50    Average packs/day: 0.7 packs/day for 17.7 years (11.8 ttl pk-yrs)    Types: Cigarettes    Start date: 2013   Smokeless tobacco: Never  Vaping Use   Vaping status: Never Used  Substance Use Topics   Alcohol use: Not Currently    Comment: sometimes   Drug use: No    Allergies: No Known Allergies  Medications Prior to Admission  Medication Sig Dispense Refill Last Dose   aspirin EC 81 MG tablet Take 1 tablet (81 mg total) by mouth daily. Swallow whole. 90 tablet 3 12/11/2022   prenatal vitamin w/FE, FA (PRENATAL 1 + 1) 27-1 MG TABS tablet Take 1 tablet by mouth daily 90 tablet 3 12/11/2022   Blood Pressure Monitoring (BLOOD PRESSURE CUFF) MISC 1 Device by Does not apply route as directed. 1 each 0     Review of Systems  Constitutional: Negative.   HENT: Negative.    Eyes:  Negative.   Respiratory: Negative.    Cardiovascular: Negative.   Gastrointestinal: Negative.   Endocrine: Negative.   Genitourinary:  Positive for hematuria.  Musculoskeletal: Negative.   Skin: Negative.   Allergic/Immunologic: Negative.   Neurological: Negative.   Hematological: Negative.   Psychiatric/Behavioral: Negative.     Physical Exam   Blood pressure 106/62, pulse 77, temperature 98.1 F (36.7 C), temperature source Oral, resp. rate 20, height 5\' 1"  (1.549 m), weight 96.2 kg, last menstrual period 04/04/2022, SpO2 97%.  Physical Exam Vitals and nursing note reviewed.  Constitutional:      Appearance: Normal appearance. She is obese.  HENT:     Head: Normocephalic and atraumatic.  Cardiovascular:     Rate and Rhythm: Normal rate.  Pulmonary:     Effort: Pulmonary effort is normal.  Abdominal:     Palpations: Abdomen is soft.  Musculoskeletal:        General: Normal range of motion.  Skin:    General: Skin is  warm and dry.  Neurological:     Mental Status: She is alert and oriented to person, place, and time.  Psychiatric:        Mood and Affect: Mood normal.        Behavior: Behavior normal.        Thought Content: Thought content normal.        Judgment: Judgment normal.    REACTIVE NST - FHR: 125 bpm / moderate variability / accels present / decels absent / TOCO: irregular UC's with UI noted  MAU Course  Procedures  MDM CCUA UCx -- Results pending   Results for orders placed or performed during the hospital encounter of 12/12/22 (from the past 24 hour(s))  Urinalysis, Routine w reflex microscopic -Urine, Clean Catch     Status: Abnormal   Collection Time: 12/12/22  3:45 PM  Result Value Ref Range   Color, Urine YELLOW YELLOW   APPearance HAZY (A) CLEAR   Specific Gravity, Urine 1.020 1.005 - 1.030   pH 7.0 5.0 - 8.0   Glucose, UA NEGATIVE NEGATIVE mg/dL   Hgb urine dipstick LARGE (A) NEGATIVE   Bilirubin Urine SMALL (A) NEGATIVE   Ketones, ur  NEGATIVE NEGATIVE mg/dL   Protein, ur NEGATIVE NEGATIVE mg/dL   Nitrite NEGATIVE NEGATIVE   Leukocytes,Ua TRACE (A) NEGATIVE  Urinalysis, Microscopic (reflex)     Status: Abnormal   Collection Time: 12/12/22  3:45 PM  Result Value Ref Range   RBC / HPF >50 0 - 5 RBC/hpf   WBC, UA 0-5 0 - 5 WBC/hpf   Bacteria, UA MANY (A) NONE SEEN   Squamous Epithelial / HPF 6-10 0 - 5 /HPF   Mucus PRESENT    Ca Oxalate Crys, UA PRESENT      Assessment and Plan  1. UTI (urinary tract infection) during pregnancy, third trimester - Information provided on UTI in pregnancy - Rx: Fosfomycin 3 gm x 1 dose   2. Benign essential microscopic hematuria - Information provided on hematuria  3. [redacted] weeks gestation   - Discharge patient - Keep scheduled appt with [facility] on 12/16/2022 - Patient verbalized an understanding of the plan of care and agrees.    Raelyn Mora, CNM 12/12/2022, 4:11 PM

## 2022-12-12 NOTE — MAU Note (Signed)
.  Valerie Greene is a 27 y.o. at [redacted]w[redacted]d here in MAU reporting: blood in urine this morning. Denies pain/burning/urgency. Does endorse frequency. LMP: 04/04/22 Onset of complaint: 12/12/22 approx 0630 Pain score: 0/10 Vitals:   12/12/22 1549 12/12/22 1550  BP: 106/62   Pulse: 77   Resp: 20   Temp: 98.1 F (36.7 C)   SpO2:  97%     FHT:145 Lab orders placed from triage:

## 2022-12-15 ENCOUNTER — Encounter: Payer: Self-pay | Admitting: Obstetrics and Gynecology

## 2022-12-15 ENCOUNTER — Other Ambulatory Visit: Payer: Self-pay | Admitting: Advanced Practice Midwife

## 2022-12-15 DIAGNOSIS — O409XX0 Polyhydramnios, unspecified trimester, not applicable or unspecified: Secondary | ICD-10-CM

## 2022-12-15 DIAGNOSIS — E66813 Obesity, class 3: Secondary | ICD-10-CM

## 2022-12-15 DIAGNOSIS — Z3480 Encounter for supervision of other normal pregnancy, unspecified trimester: Secondary | ICD-10-CM

## 2022-12-15 LAB — CULTURE, OB URINE: Culture: 10000 — AB

## 2022-12-16 ENCOUNTER — Ambulatory Visit (INDEPENDENT_AMBULATORY_CARE_PROVIDER_SITE_OTHER): Payer: Medicaid Other | Admitting: Women's Health

## 2022-12-16 ENCOUNTER — Ambulatory Visit (INDEPENDENT_AMBULATORY_CARE_PROVIDER_SITE_OTHER): Payer: Medicaid Other

## 2022-12-16 ENCOUNTER — Encounter: Payer: Self-pay | Admitting: Women's Health

## 2022-12-16 ENCOUNTER — Other Ambulatory Visit (HOSPITAL_COMMUNITY)
Admission: RE | Admit: 2022-12-16 | Discharge: 2022-12-16 | Disposition: A | Discharge status: Home / Self Care | Payer: Medicaid Other | Source: Ambulatory Visit | Attending: Women's Health | Admitting: Women's Health

## 2022-12-16 VITALS — BP 103/66 | HR 108 | Wt 219.4 lb

## 2022-12-16 DIAGNOSIS — O99891 Other specified diseases and conditions complicating pregnancy: Secondary | ICD-10-CM

## 2022-12-16 DIAGNOSIS — Z23 Encounter for immunization: Secondary | ICD-10-CM | POA: Diagnosis not present

## 2022-12-16 DIAGNOSIS — Z3A36 36 weeks gestation of pregnancy: Secondary | ICD-10-CM

## 2022-12-16 DIAGNOSIS — Z3480 Encounter for supervision of other normal pregnancy, unspecified trimester: Secondary | ICD-10-CM

## 2022-12-16 DIAGNOSIS — Z3483 Encounter for supervision of other normal pregnancy, third trimester: Secondary | ICD-10-CM | POA: Insufficient documentation

## 2022-12-16 DIAGNOSIS — O403XX0 Polyhydramnios, third trimester, not applicable or unspecified: Secondary | ICD-10-CM

## 2022-12-16 DIAGNOSIS — O09899 Supervision of other high risk pregnancies, unspecified trimester: Secondary | ICD-10-CM

## 2022-12-16 DIAGNOSIS — R8271 Bacteriuria: Secondary | ICD-10-CM

## 2022-12-16 DIAGNOSIS — O409XX0 Polyhydramnios, unspecified trimester, not applicable or unspecified: Secondary | ICD-10-CM

## 2022-12-16 DIAGNOSIS — O0993 Supervision of high risk pregnancy, unspecified, third trimester: Secondary | ICD-10-CM

## 2022-12-16 NOTE — Progress Notes (Addendum)
HIGH-RISK PREGNANCY VISIT Patient name: Valerie Greene MRN [id number]  Date of birth: [birth year] Chief Complaint:   Routine Prenatal Visit (culture) and Pregnancy Ultrasound  History of Present Illness:   Valerie Greene is a 27 y.o. G23P1011 female at [redacted]w[redacted]d with an Estimated Date of Delivery: 01/09/23 being seen today for ongoing management of a high-risk pregnancy complicated by mild polyhydramnios.    Today she reports no complaints. Contractions: Not present.  .  Movement: Present. denies leaking of fluid.      07/07/2022   10:02 AM 07/22/2019   11:26 AM 09/09/2017    2:16 PM 06/04/2016    9:10 AM  Depression screen PHQ 2/9  Decreased Interest 0 0 0 0  Down, Depressed, Hopeless 0 0 0 0  PHQ - 2 Score 0 0 0 0  Altered sleeping 0 0    Tired, decreased energy 3 0    Change in appetite 0 0    Feeling bad or failure about yourself  0 0    Trouble concentrating 0 0    Moving slowly or fidgety/restless 0 0    Suicidal thoughts 0 0    PHQ-9 Score 3 0          07/07/2022   10:02 AM 07/22/2019   11:26 AM  GAD 7 : Generalized Anxiety Score  Nervous, Anxious, on Edge 0 0  Control/stop worrying 0 0  Worry too much - different things 0 0  Trouble relaxing 0 0  Restless 0 0  Easily annoyed or irritable 0 0  Afraid - awful might happen 0 0  Total GAD 7 Score 0 0     Review of Systems:   Pertinent items are noted in HPI Denies abnormal vaginal discharge w/ itching/odor/irritation, headaches, visual changes, shortness of breath, chest pain, abdominal pain, severe nausea/vomiting, or problems with urination or bowel movements unless otherwise stated above. Pertinent History Reviewed:  Reviewed past medical,surgical, social, obstetrical and family history.  Reviewed problem list, medications and allergies. Physical Assessment:   Vitals:   12/16/22 0904  BP: 103/66  Pulse: (!) 108  Weight: 219 lb 6.4 oz (99.5 kg)  Body mass index is 41.46 kg/m.           Physical Examination:    General appearance: alert, well appearing, and in no distress  Mental status: alert, oriented to person, place, and time  Skin: warm & dry   Extremities: Edema: None    Cardiovascular: normal heart rate noted  Respiratory: normal respiratory effort, no distress  Abdomen: gravid, soft, non-tender  Pelvic: Cervical exam performed  Dilation: Closed Effacement (%): Thick Station: Ballotable  Fetal Status:     Movement: Present Presentation: Vertex  Fetal Surveillance Testing today: [place] 36+4 wks,cephalic,BPP 8/8,anterior placenta gr 3,AFI 27 cm,polyhydramnios,FHR 152 bpm   Chaperone: Peggy Dones    No results found for this or any previous visit (from the past 24 hour(s)).  Assessment & Plan:  High-risk pregnancy: G3P1011 at [redacted]w[redacted]d with an Estimated Date of Delivery: 01/09/23   1) Mild polyhydramnios, AFI 27 today, IOL scheduled for 10/4 AM,  IOL form faxed and orders placed   2) Recent UTI> urine cx poc today  Meds: No orders of the defined types were placed in this encounter.   Labs/procedures today: GBS, GC/CT, SVE, and U/S  Treatment Plan:  U/S q4wks      weekly BPP or 2x/wk NST    Deliver @ 39-40wks:____   Reviewed: Preterm labor symptoms and  general obstetric precautions including but not limited to vaginal bleeding, contractions, leaking of fluid and fetal movement were reviewed in detail with the patient.  All questions were answered. Does have home bp cuff. Office bp cuff given: not applicable. Check bp weekly, let us know if consistently >140 and/or >90.  Follow-up: Return for As scheduled.   Future Appointments  Date Time Provider Department Center  12/16/2022  9:30 AM Cheral Marker, CNM [facility]YN  12/19/2022  9:50 AM [facility] NURSE [facility]  12/23/2022  8:30 AM [facility] - [facility]YN [place] [facility] None  12/23/2022  9:30 AM Cheral Marker, CNM [facility]  12/26/2022  9:50 AM [facility] NURSE [facility]  12/30/2022  8:30 AM [facility] - [facility] [place] [facility]  None  12/30/2022  9:30 AM Lazaro Arms, MD [facility] FTO[facility]YN  01/02/2023  6:45 AM [facility] SCHED ROOM [facility] None  01/02/2023  9:30 AM [facility] NURSE [facility]  01/06/2023  8:30 AM [facility] - [facility]YN [place] [facility] None  01/06/2023  9:30 AM Lazaro Arms, MD [facility]  01/09/2023 11:10 AM [facility] NURSE [facility]    Orders Placed This Encounter  Procedures   Culture, beta strep (group b only)   Cheral Marker CNM, [facility] 12/16/2022 9:24 AM

## 2022-12-16 NOTE — Progress Notes (Signed)
[place] 36+4 wks,cephalic,BPP 8/8,anterior placenta gr 3,AFI 27 cm,polyhydramnios,FHR 152 bpm

## 2022-12-16 NOTE — Patient Instructions (Signed)
Marchelle Folks, thank you for choosing our office today! We appreciate the opportunity to meet your healthcare needs. You may receive a short survey by mail, e-mail, or through [employer]. If you are happy with your care we would appreciate if you could take just a few minutes to complete the survey questions. We read all of your comments and take your feedback very seriously. Thank you again for choosing our office.  Center for [employer] Team at [facility]  [facility] at [facility] ([address] [place], [place] [address]) Entrance C, located off of [address] Free 24/7 valet parking   CLASSES: Go to [email or web address] to register for classes (childbirth, breastfeeding, waterbirth, infant CPR, daddy bootcamp, etc.)  Call the office [phone]) or go to [facility] if: You begin to have strong, frequent contractions Your water breaks.  Sometimes it is a big gush of fluid, sometimes it is just a trickle that keeps getting your panties wet or running down your legs You have vaginal bleeding.  It is normal to have a small amount of spotting if your cervix was checked.  You don't feel your baby moving like normal.  If you don't, get you something to eat and drink and lay down and focus on feeling your baby move.   If your baby is still not moving like normal, you should call the office or go to [facility]  Call the office [phone]) or go to [facility] hospital for these signs of pre-eclampsia: Severe headache that does not go away with Tylenol Visual changes- seeing spots, double, blurred vision Pain under your right breast or upper abdomen that does not go away with Tums or heartburn medicine Nausea and/or vomiting Severe swelling in your hands, feet, and face   Tdap Vaccine It is recommended that you get the Tdap vaccine during the third trimester of EACH pregnancy to help protect your baby from getting pertussis (whooping cough) 27-36 weeks is the BEST time to do  this so that you can pass the protection on to your baby. During pregnancy is better than after pregnancy, but if you are unable to get it during pregnancy it will be offered at the hospital.  You can get this vaccine with [place], at the health department, your family doctor, or some local pharmacies Everyone who will be around your baby should also be up-to-date on their vaccines before the baby comes. Adults (who are not pregnant) only need 1 dose of Tdap during adulthood.   [facility] Pediatricians/Family Doctors [facility]): [address] Colette Ribas, [phone]           [facility]: [address], [phone]                [facility] Medicine [facility]): [address], [phone] (call to ask if accepting patients) [facility] Department: [address], [place], [phone]    [facility] Pediatricians/Family Doctors [facility]): [phone], [phone] [facility]: [address] [place], [phone] [facility]: [address], [phone]  [facility] Doctors  [facility]): [phone] [facility]: [address], [phone]   [facility] Doctors [facility]: [address], [phone]  [facility] Family Doctors  [facility]: [phone], [phone]  Home Blood Pressure Monitoring for Patients   Your provider has recommended that you check your  blood pressure (BP) at least once a week at home. If you do not have a blood pressure cuff at home, one will be provided for you. Contact your provider if you have not received your monitor within 1 week.   Helpful Tips for Accurate Home Blood Pressure Checks  Don't smoke, exercise, or drink caffeine 30 minutes before checking your BP Use the restroom before checking your BP (a full bladder can raise your  pressure) Relax in a comfortable upright chair Feet on the ground Left arm resting comfortably on a flat surface at the level of your heart Legs uncrossed Back supported Sit quietly and don't talk Place the cuff on your bare arm Adjust snuggly, so that only two fingertips can fit between your skin and the top of the cuff Check 2 readings separated by at least one minute Keep a log of your BP readings For a visual, please reference this diagram: http://ccnc.care/bpdiagram  Provider Name: [facility]     Phone: [phone]  Zone 1: ALL CLEAR  Continue to monitor your symptoms:  BP reading is less than 140 (top number) or less than 90 (bottom number)  No right upper stomach pain No headaches or seeing spots No feeling nauseated or throwing up No swelling in face and hands  Zone 2: CAUTION Call your doctor's office for any of the following:  BP reading is greater than 140 (top number) or greater than 90 (bottom number)  Stomach pain under your ribs in the middle or right side Headaches or seeing spots Feeling nauseated or throwing up Swelling in face and hands  Zone 3: EMERGENCY  Seek immediate medical care if you have any of the following:  BP reading is greater than160 (top number) or greater than 110 (bottom number) Severe headaches not improving with Tylenol Serious difficulty catching your breath Any worsening symptoms from Zone 2  Preterm Labor and Birth Information  The normal length of a pregnancy is 39-41 weeks. Preterm labor is when labor starts before 37 completed weeks of pregnancy. What are the risk factors for preterm labor? Preterm labor is more likely to occur in women who: Have certain infections during pregnancy such as a bladder infection, sexually transmitted infection, or infection inside the uterus (chorioamnionitis). Have a shorter-than-normal cervix. Have gone into preterm labor before. Have had surgery on their cervix. Are younger than age 68  or older than age 70. Are African American. Are pregnant with twins or multiple babies (multiple gestation). Take street drugs or smoke while pregnant. Do not gain enough weight while pregnant. Became pregnant shortly after having been pregnant. What are the symptoms of preterm labor? Symptoms of preterm labor include: Cramps similar to those that can happen during a menstrual period. The cramps may happen with diarrhea. Pain in the abdomen or lower back. Regular uterine contractions that may feel like tightening of the abdomen. A feeling of increased pressure in the pelvis. Increased watery or bloody mucus discharge from the vagina. Water breaking (ruptured amniotic sac). Why is it important to recognize signs of preterm labor? It is important to recognize signs of preterm labor because babies who are born prematurely may not be fully developed. This can put them at an increased risk for: Long-term (chronic) heart and lung problems. Difficulty immediately after birth with regulating body systems, including blood sugar, body temperature, heart rate, and breathing rate. Bleeding in the brain. Cerebral palsy. Learning difficulties. Death. These risks are highest for babies who are born before 34 weeks  of pregnancy. How is preterm labor treated? Treatment depends on the length of your pregnancy, your condition, and the health of your baby. It may involve: Having a stitch (suture) placed in your cervix to prevent your cervix from opening too early (cerclage). Taking or being given medicines, such as: Hormone medicines. These may be given early in pregnancy to help support the pregnancy. Medicine to stop contractions. Medicines to help mature the baby's lungs. These may be prescribed if the risk of delivery is high. Medicines to prevent your baby from developing cerebral palsy. If the labor happens before 34 weeks of pregnancy, you may need to stay in the hospital. What should I do if I  think I am in preterm labor? If you think that you are going into preterm labor, call your health care provider right away. How can I prevent preterm labor in future pregnancies? To increase your chance of having a full-term pregnancy: Do not use any tobacco products, such as cigarettes, chewing tobacco, and e-cigarettes. If you need help quitting, ask your health care provider. Do not use street drugs or medicines that have not been prescribed to you during your pregnancy. Talk with your health care provider before taking any herbal supplements, even if you have been taking them regularly. Make sure you gain a healthy amount of weight during your pregnancy. Watch for infection. If you think that you might have an infection, get it checked right away. Make sure to tell your health care provider if you have gone into preterm labor before. This information is not intended to replace advice given to you by your health care provider. Make sure you discuss any questions you have with your health care provider. Document Revised: 07/09/2018 Document Reviewed: 08/08/2015 Elsevier Patient Education  2020 [employer].

## 2022-12-16 NOTE — Addendum Note (Signed)
Addended by: Federico Flake A on: 12/16/2022 09:50 AM   Modules accepted: Orders

## 2022-12-16 NOTE — Addendum Note (Signed)
Addended by: Federico Flake A on: 12/16/2022 09:41 AM   Modules accepted: Orders

## 2022-12-19 ENCOUNTER — Ambulatory Visit (INDEPENDENT_AMBULATORY_CARE_PROVIDER_SITE_OTHER): Payer: Medicaid Other | Admitting: *Deleted

## 2022-12-19 VITALS — BP 103/71 | HR 93

## 2022-12-19 DIAGNOSIS — Z3A37 37 weeks gestation of pregnancy: Secondary | ICD-10-CM | POA: Diagnosis not present

## 2022-12-19 DIAGNOSIS — O403XX0 Polyhydramnios, third trimester, not applicable or unspecified: Secondary | ICD-10-CM | POA: Diagnosis not present

## 2022-12-19 DIAGNOSIS — O09893 Supervision of other high risk pregnancies, third trimester: Secondary | ICD-10-CM

## 2022-12-19 DIAGNOSIS — O09899 Supervision of other high risk pregnancies, unspecified trimester: Secondary | ICD-10-CM

## 2022-12-19 LAB — CULTURE, BETA STREP (GROUP B ONLY): Strep Gp B Culture: NEGATIVE

## 2022-12-19 NOTE — Progress Notes (Signed)
   NURSE VISIT- NST  SUBJECTIVE:  Valerie Greene is a 27 y.o. G68P1011 female at [redacted]w[redacted]d, here for a NST for pregnancy complicated by Morbid obesity (BMI >=40) and Polyhydramnios.  She reports active fetal movement, contractions: none, vaginal bleeding: none, membranes: intact.   OBJECTIVE:  LMP 04/04/2022   Appears well, no apparent distress  No results found for this or any previous visit (from the past 24 hour(s)).  NST: FHR baseline 150 bpm, Variability: moderate, Accelerations:present, Decelerations:  Absent= Cat 1/reactive Toco: none   ASSESSMENT: G3P1011 at [redacted]w[redacted]d with Morbid obesity (BMI >=40) and Polyhydramnios NST reactive  PLAN: EFM strip reviewed by Dr. Despina Hidden   Recommendations: keep next appointment as scheduled    Jobe Marker  12/19/2022 10:06 AM

## 2022-12-20 LAB — URINE CULTURE

## 2022-12-22 ENCOUNTER — Encounter: Payer: Self-pay | Admitting: Advanced Practice Midwife

## 2022-12-22 ENCOUNTER — Other Ambulatory Visit: Payer: Self-pay | Admitting: Obstetrics & Gynecology

## 2022-12-22 DIAGNOSIS — O409XX0 Polyhydramnios, unspecified trimester, not applicable or unspecified: Secondary | ICD-10-CM

## 2022-12-22 DIAGNOSIS — O99213 Obesity complicating pregnancy, third trimester: Secondary | ICD-10-CM

## 2022-12-22 MED ORDER — CEPHALEXIN 500 MG PO CAPS: 500.0000 mg | ORAL_CAPSULE | Freq: Four times a day (QID) | ORAL | 0 refills | Status: DC

## 2022-12-22 NOTE — Addendum Note (Signed)
Addended by: Cheral Marker on: 12/22/2022 09:03 AM   Modules accepted: Orders

## 2022-12-23 ENCOUNTER — Ambulatory Visit: Payer: Medicaid Other

## 2022-12-23 ENCOUNTER — Encounter: Payer: Self-pay | Admitting: Women's Health

## 2022-12-23 ENCOUNTER — Ambulatory Visit (INDEPENDENT_AMBULATORY_CARE_PROVIDER_SITE_OTHER): Payer: Medicaid Other | Admitting: Women's Health

## 2022-12-23 VITALS — BP 102/69 | HR 88 | Wt 218.6 lb

## 2022-12-23 DIAGNOSIS — Z3A37 37 weeks gestation of pregnancy: Secondary | ICD-10-CM | POA: Diagnosis not present

## 2022-12-23 DIAGNOSIS — O403XX0 Polyhydramnios, third trimester, not applicable or unspecified: Secondary | ICD-10-CM

## 2022-12-23 DIAGNOSIS — O99213 Obesity complicating pregnancy, third trimester: Secondary | ICD-10-CM

## 2022-12-23 DIAGNOSIS — O09899 Supervision of other high risk pregnancies, unspecified trimester: Secondary | ICD-10-CM

## 2022-12-23 DIAGNOSIS — O0993 Supervision of high risk pregnancy, unspecified, third trimester: Secondary | ICD-10-CM

## 2022-12-23 DIAGNOSIS — O409XX0 Polyhydramnios, unspecified trimester, not applicable or unspecified: Secondary | ICD-10-CM

## 2022-12-23 NOTE — Progress Notes (Signed)
HIGH-RISK PREGNANCY VISIT Patient name: Valerie Greene MRN [id number]  Date of birth: 09/15/1995 Chief Complaint:   Routine Prenatal Visit (Ultrasound today)  History of Present Illness:   Valerie Greene is a 27 y.o. G30P1011 female at [redacted]w[redacted]d with an Estimated Date of Delivery: 01/09/23 being seen today for ongoing management of a high-risk pregnancy complicated by morbid obesity BMI >=40 and polyhydramnios.    Today she reports occasional contractions. Contractions: Not present. Vag. Bleeding: None.  Movement: Present. denies leaking of fluid.      07/07/2022   10:02 AM 07/22/2019   11:26 AM 09/09/2017    2:16 PM 06/04/2016    9:10 AM  Depression screen PHQ 2/9  Decreased Interest 0 0 0 0  Down, Depressed, Hopeless 0 0 0 0  PHQ - 2 Score 0 0 0 0  Altered sleeping 0 0    Tired, decreased energy 3 0    Change in appetite 0 0    Feeling bad or failure about yourself  0 0    Trouble concentrating 0 0    Moving slowly or fidgety/restless 0 0    Suicidal thoughts 0 0    PHQ-9 Score 3 0          07/07/2022   10:02 AM 07/22/2019   11:26 AM  GAD 7 : Generalized Anxiety Score  Nervous, Anxious, on Edge 0 0  Control/stop worrying 0 0  Worry too much - different things 0 0  Trouble relaxing 0 0  Restless 0 0  Easily annoyed or irritable 0 0  Afraid - awful might happen 0 0  Total GAD 7 Score 0 0     Review of Systems:   Pertinent items are noted in HPI Denies abnormal vaginal discharge w/ itching/odor/irritation, headaches, visual changes, shortness of breath, chest pain, abdominal pain, severe nausea/vomiting, or problems with urination or bowel movements unless otherwise stated above. Pertinent History Reviewed:  Reviewed past medical,surgical, social, obstetrical and family history.  Reviewed problem list, medications and allergies. Physical Assessment:   Vitals:   12/23/22 0913  BP: 102/69  Pulse: 88  Weight: 218 lb 9.6 oz (99.2 kg)  Body mass index is 41.3 kg/m.            Physical Examination:   General appearance: alert, well appearing, and in no distress  Mental status: alert, oriented to person, place, and time  Skin: warm & dry   Extremities: Edema: None    Cardiovascular: normal heart rate noted  Respiratory: normal respiratory effort, no distress  Abdomen: gravid, soft, non-tender  Pelvic: Cervical exam deferred         Fetal Surveillance Testing today: [place] 37+4 wks,cephalic,BPP 8/8,anterior placenta gr 3,polyhydramnios,AFI 28 cm,FHR 152 bpm   Chaperone: N/A    No results found for this or any previous visit (from the past 24 hour(s)).  Assessment & Plan:  High-risk pregnancy: G3P1011 at [redacted]w[redacted]d with an Estimated Date of Delivery: 01/09/23   1) Polyhydramnios, AFI 28cm today, IOL as scheduled 10/4  2) PG BMI >=40, stable  3) Recent ASB> continue keflex treatment  Meds: No orders of the defined types were placed in this encounter.   Labs/procedures today: U/S  Treatment Plan:  U/S q4wks      weekly BPP or 2x/wk NST    Deliver @ 39-40wks:____   Reviewed: Preterm labor symptoms and general obstetric precautions including but not limited to vaginal bleeding, contractions, leaking of fluid and fetal movement were reviewed in  detail with the patient.  All questions were answered. Does have home bp cuff. Office bp cuff given: not applicable. Check bp weekly, let us know if consistently >140 and/or >90.  Follow-up: Return for As scheduled.   Future Appointments  Date Time Provider Department Center  12/26/2022  9:50 AM [facility] NURSE [facility]  12/30/2022  8:30 AM [facility] [place] [facility] None  12/30/2022  9:30 AM Lazaro Arms, MD [facility]  01/02/2023  6:45 AM [facility]-LD SCHED ROOM [facility]-INDC None    No orders of the defined types were placed in this encounter.  Cheral Marker CNM, [id number] [facility] 12/23/2022 9:32 AM

## 2022-12-23 NOTE — Patient Instructions (Signed)
Marchelle Folks, thank you for choosing our office today! We appreciate the opportunity to meet your healthcare needs. You may receive a short survey by mail, e-mail, or through [employer]. If you are happy with your care we would appreciate if you could take just a few minutes to complete the survey questions. We read all of your comments and take your feedback very seriously. Thank you again for choosing our office.  Center for [employer] Team at [facility]  [facility] at [facility] ([address] [place], [place] [address]) Entrance C, located off of [address] Free 24/7 valet parking   CLASSES: Go to [email or web address] to register for classes (childbirth, breastfeeding, waterbirth, infant CPR, daddy bootcamp, etc.)  Call the office [phone]) or go to [facility] if: You begin to have strong, frequent contractions Your water breaks.  Sometimes it is a big gush of fluid, sometimes it is just a trickle that keeps getting your panties wet or running down your legs You have vaginal bleeding.  It is normal to have a small amount of spotting if your cervix was checked.  You don't feel your baby moving like normal.  If you don't, get you something to eat and drink and lay down and focus on feeling your baby move.   If your baby is still not moving like normal, you should call the office or go to [facility].  Call the office [phone]) or go to [facility] hospital for these signs of pre-eclampsia: Severe headache that does not go away with Tylenol Visual changes- seeing spots, double, blurred vision Pain under your right breast or upper abdomen that does not go away with Tums or heartburn medicine Nausea and/or vomiting Severe swelling in your hands, feet, and face   [facility] Pediatricians/Family Doctors [facility]): [address] Colette Ribas, [phone]           [facility]: [address], [phone]                 [facility]): [address], [phone] (call to ask if accepting patients) [facility] Department: [address] [place], [phone]    [facility] Pediatricians/Family Doctors [facility]): [address], [phone] [facility]: [address] [place], [phone] [facility]: [address], [phone]  [facility] Doctors  [facility]): [phone] [facility]: [address] [phone]   [facility] Doctors [facility]: [address] [phone]  [facility] Doctors  [facility]: [phone], [phone]  Home Blood Pressure Monitoring for Patients   Your provider has recommended that you check your blood pressure (BP) at least once a week at home. If you do not have a blood pressure cuff at home, one will be provided for you. Contact your provider if you have not received your monitor within 1 week.   Helpful Tips for Accurate Home Blood Pressure Checks  Don't smoke, exercise, or drink caffeine 30 minutes before checking your BP Use the restroom before checking your BP (a full bladder can raise your pressure) Relax in a comfortable upright chair Feet on the ground Left arm resting comfortably on a flat surface at the level of your heart Legs uncrossed Back supported Sit quietly and don't talk Place the cuff on your bare arm Adjust snuggly, so that only two fingertips  can fit between your skin and the top of the cuff Check 2 readings separated by at least one minute Keep a log of your BP readings For a visual, please reference this diagram: http://ccnc.care/bpdiagram  Provider Name: [facility]     Phone: [phone]  Zone 1: ALL CLEAR  Continue to monitor your symptoms:  BP reading is less than 140 (top number) or less than 90 (bottom number)  No right  upper stomach pain No headaches or seeing spots No feeling nauseated or throwing up No swelling in face and hands  Zone 2: CAUTION Call your doctor's office for any of the following:  BP reading is greater than 140 (top number) or greater than 90 (bottom number)  Stomach pain under your ribs in the middle or right side Headaches or seeing spots Feeling nauseated or throwing up Swelling in face and hands  Zone 3: EMERGENCY  Seek immediate medical care if you have any of the following:  BP reading is greater than160 (top number) or greater than 110 (bottom number) Severe headaches not improving with Tylenol Serious difficulty catching your breath Any worsening symptoms from Zone 2   Braxton Hicks Contractions Contractions of the uterus can occur throughout pregnancy, but they are not always a sign that you are in labor. You may have practice contractions called Braxton Hicks contractions. These false labor contractions are sometimes confused with true labor. What are Deberah Pelton contractions? Braxton Hicks contractions are tightening movements that occur in the muscles of the uterus before labor. Unlike true labor contractions, these contractions do not result in opening (dilation) and thinning of the cervix. Toward the end of pregnancy (32-34 weeks), Braxton Hicks contractions can happen more often and may become stronger. These contractions are sometimes difficult to tell apart from true labor because they can be very uncomfortable. You should not feel embarrassed if you go to the hospital with false labor. Sometimes, the only way to tell if you are in true labor is for your health care provider to look for changes in the cervix. The health care provider will do a physical exam and may monitor your contractions. If you are not in true labor, the exam should show that your cervix is not dilating and your water has not broken. If there are no other health problems associated with your  pregnancy, it is completely safe for you to be sent home with false labor. You may continue to have Braxton Hicks contractions until you go into true labor. How to tell the difference between true labor and false labor True labor Contractions last 30-70 seconds. Contractions become very regular. Discomfort is usually felt in the top of the uterus, and it spreads to the lower abdomen and low back. Contractions do not go away with walking. Contractions usually become more intense and increase in frequency. The cervix dilates and gets thinner. False labor Contractions are usually shorter and not as strong as true labor contractions. Contractions are usually irregular. Contractions are often felt in the front of the lower abdomen and in the groin. Contractions may go away when you walk around or change positions while lying down. Contractions get weaker and are shorter-lasting as time goes on. The cervix usually does not dilate or become thin. Follow these instructions at home:  Take over-the-counter and prescription medicines only as told by your health care provider. Keep up with your usual exercises and follow other instructions from your health care provider. Eat and drink lightly if you think  you are going into labor. If Braxton Hicks contractions are making you uncomfortable: Change your position from lying down or resting to walking, or change from walking to resting. Sit and rest in a tub of warm water. Drink enough fluid to keep your urine pale yellow. Dehydration may cause these contractions. Do slow and deep breathing several times an hour. Keep all follow-up prenatal visits as told by your health care provider. This is important. Contact a health care provider if: You have a fever. You have continuous pain in your abdomen. Get help right away if: Your contractions become stronger, more regular, and closer together. You have fluid leaking or gushing from your vagina. You pass  blood-tinged mucus (bloody show). You have bleeding from your vagina. You have low back pain that you never had before. You feel your baby's head pushing down and causing pelvic pressure. Your baby is not moving inside you as much as it used to. Summary Contractions that occur before labor are called Braxton Hicks contractions, false labor, or practice contractions. Braxton Hicks contractions are usually shorter, weaker, farther apart, and less regular than true labor contractions. True labor contractions usually become progressively stronger and regular, and they become more frequent. Manage discomfort from [facility] contractions by changing position, resting in a warm bath, drinking plenty of water, or practicing deep breathing. This information is not intended to replace advice given to you by your health care provider. Make sure you discuss any questions you have with your health care provider. Document Revised: 02/27/2017 Document Reviewed: 07/31/2016 Elsevier Patient Education  2020 [employer].

## 2022-12-23 NOTE — Progress Notes (Signed)
[place] 37+4 wks,cephalic,BPP 8/8,anterior placenta gr 3,polyhydramnios,AFI 28 cm,FHR 152 bpm

## 2022-12-26 ENCOUNTER — Ambulatory Visit (INDEPENDENT_AMBULATORY_CARE_PROVIDER_SITE_OTHER): Payer: Medicaid Other | Admitting: *Deleted

## 2022-12-26 VITALS — BP 113/73 | HR 104 | Wt 218.2 lb

## 2022-12-26 DIAGNOSIS — O09899 Supervision of other high risk pregnancies, unspecified trimester: Secondary | ICD-10-CM

## 2022-12-26 DIAGNOSIS — O403XX0 Polyhydramnios, third trimester, not applicable or unspecified: Secondary | ICD-10-CM

## 2022-12-26 DIAGNOSIS — Z3A38 38 weeks gestation of pregnancy: Secondary | ICD-10-CM | POA: Diagnosis not present

## 2022-12-26 DIAGNOSIS — O09893 Supervision of other high risk pregnancies, third trimester: Secondary | ICD-10-CM | POA: Diagnosis not present

## 2022-12-26 NOTE — Progress Notes (Signed)
   NURSE VISIT- NST  SUBJECTIVE:  Valerie Greene is a 27 y.o. G53P1011 female at [redacted]w[redacted]d, here for a NST for pregnancy complicated by Polyhydramnios and BMI>40.  She reports active fetal movement, contractions: none, vaginal bleeding: none, membranes: intact.   OBJECTIVE:  BP 113/73   Pulse (!) 104   Wt 218 lb 3.2 oz (99 kg)   LMP 04/04/2022   BMI 41.23 kg/m   Appears well, no apparent distress  No results found for this or any previous visit (from the past 24 hour(s)).  NST: FHR baseline 150 bpm, Variability: moderate, Accelerations:present, Decelerations:  Absent= Cat 1/reactive Toco: none   ASSESSMENT: G3P1011 at [redacted]w[redacted]d with Morbid obesity (BMI >=40) and Polyhydramnios NST reactive  PLAN: EFM strip reviewed by Dr. Despina Hidden   Recommendations: keep next appointment as scheduled    Jobe Marker  12/26/2022 10:26 AM

## 2022-12-28 ENCOUNTER — Other Ambulatory Visit: Payer: Self-pay | Admitting: Advanced Practice Midwife

## 2022-12-29 ENCOUNTER — Other Ambulatory Visit: Payer: Self-pay | Admitting: Obstetrics & Gynecology

## 2022-12-29 DIAGNOSIS — O409XX0 Polyhydramnios, unspecified trimester, not applicable or unspecified: Secondary | ICD-10-CM

## 2022-12-30 ENCOUNTER — Encounter (HOSPITAL_COMMUNITY): Payer: Self-pay | Admitting: *Deleted

## 2022-12-30 ENCOUNTER — Ambulatory Visit (INDEPENDENT_AMBULATORY_CARE_PROVIDER_SITE_OTHER): Payer: Medicaid Other

## 2022-12-30 ENCOUNTER — Telehealth (HOSPITAL_COMMUNITY): Payer: Self-pay | Admitting: *Deleted

## 2022-12-30 ENCOUNTER — Ambulatory Visit (INDEPENDENT_AMBULATORY_CARE_PROVIDER_SITE_OTHER): Payer: Medicaid Other | Admitting: Obstetrics & Gynecology

## 2022-12-30 VITALS — BP 100/68 | HR 82 | Wt 220.0 lb

## 2022-12-30 DIAGNOSIS — O403XX0 Polyhydramnios, third trimester, not applicable or unspecified: Secondary | ICD-10-CM

## 2022-12-30 DIAGNOSIS — O09899 Supervision of other high risk pregnancies, unspecified trimester: Secondary | ICD-10-CM

## 2022-12-30 DIAGNOSIS — O409XX0 Polyhydramnios, unspecified trimester, not applicable or unspecified: Secondary | ICD-10-CM | POA: Diagnosis not present

## 2022-12-30 DIAGNOSIS — Z3A38 38 weeks gestation of pregnancy: Secondary | ICD-10-CM | POA: Diagnosis not present

## 2022-12-30 DIAGNOSIS — O09893 Supervision of other high risk pregnancies, third trimester: Secondary | ICD-10-CM

## 2022-12-30 NOTE — Progress Notes (Signed)
[place] 38+4 wks,cephalic,BPP 8/8,anterior placenta gr 2,polyhydramnios,AFI 27 cm,FHR 169 bpm

## 2022-12-30 NOTE — Telephone Encounter (Signed)
Preadmission screen  

## 2022-12-30 NOTE — Progress Notes (Signed)
HIGH-RISK PREGNANCY VISIT Patient name: Valerie Greene MRN [id number]  Date of birth: [birth year] Chief Complaint:   Routine Prenatal Visit  History of Present Illness:   Valerie Greene is a 27 y.o. G43P1011 female at [redacted]w[redacted]d with an Estimated Date of Delivery: 01/09/23 being seen today for ongoing management of a high-risk pregnancy complicated by mild polyhydramnios.    Today she reports no complaints. Contractions: Not present. Vag. Bleeding: None.  Movement: Present. denies leaking of fluid.      07/07/2022   10:02 AM 07/22/2019   11:26 AM 09/09/2017    2:16 PM 06/04/2016    9:10 AM  Depression screen PHQ 2/9  Decreased Interest 0 0 0 0  Down, Depressed, Hopeless 0 0 0 0  PHQ - 2 Score 0 0 0 0  Altered sleeping 0 0    Tired, decreased energy 3 0    Change in appetite 0 0    Feeling bad or failure about yourself  0 0    Trouble concentrating 0 0    Moving slowly or fidgety/restless 0 0    Suicidal thoughts 0 0    PHQ-9 Score 3 0          07/07/2022   10:02 AM 07/22/2019   11:26 AM  GAD 7 : Generalized Anxiety Score  Nervous, Anxious, on Edge 0 0  Control/stop worrying 0 0  Worry too much - different things 0 0  Trouble relaxing 0 0  Restless 0 0  Easily annoyed or irritable 0 0  Afraid - awful might happen 0 0  Total GAD 7 Score 0 0     Review of Systems:   Pertinent items are noted in HPI Denies abnormal vaginal discharge w/ itching/odor/irritation, headaches, visual changes, shortness of breath, chest pain, abdominal pain, severe nausea/vomiting, or problems with urination or bowel movements unless otherwise stated above. Pertinent History Reviewed:  Reviewed past medical,surgical, social, obstetrical and family history.  Reviewed problem list, medications and allergies. Physical Assessment:   Vitals:   12/30/22 0853  BP: 100/68  Pulse: 82  Weight: 220 lb (99.8 kg)  Body mass index is 41.57 kg/m.           Physical Examination:   General appearance:  alert, well appearing, and in no distress  Mental status: alert, oriented to person, place, and time  Skin: warm & dry   Extremities:      Cardiovascular: normal heart rate noted  Respiratory: normal respiratory effort, no distress  Abdomen: gravid, soft, non-tender  Pelvic: Cervical exam deferred         Fetal Status:     Movement: Present    Fetal Surveillance Testing today: BPP 8/8 AFI 28 cm   Chaperone: N/A    No results found for this or any previous visit (from the past 24 hour(s)).  Assessment & Plan:  High-risk pregnancy: G3P1011 at [redacted]w[redacted]d with an Estimated Date of Delivery: 01/09/23      ICD-10-CM   1. Supervision of other high risk pregnancy, antepartum  O09.899     2. Polyhydramnios, third trimester, 28 cm  O40.3XX0        Meds: No orders of the defined types were placed in this encounter.   Orders: No orders of the defined types were placed in this encounter.    Labs/procedures today: U/S  Treatment Plan:  planned IOL 01/02/23    Follow-up: No follow-ups on file.   Future Appointments  Date Time Provider Department  Center  12/30/2022  9:30 AM Lazaro Arms, MD [facility]  01/02/2023  6:45 AM [facility] SCHED ROOM [facility] None    No orders of the defined types were placed in this encounter.  Lazaro Arms  Attending Physician for the Center for [facility] 12/30/2022 9:29 AM

## 2023-01-02 ENCOUNTER — Encounter (HOSPITAL_COMMUNITY): Payer: Self-pay | Admitting: Obstetrics & Gynecology

## 2023-01-02 ENCOUNTER — Other Ambulatory Visit: Payer: Medicaid Other

## 2023-01-02 ENCOUNTER — Inpatient Hospital Stay (HOSPITAL_COMMUNITY)
Admission: RE | Admit: 2023-01-02 | Discharge: 2023-01-05 | DRG: 807 | Disposition: A | Discharge status: Home / Self Care | Payer: Medicaid Other | Attending: Obstetrics and Gynecology | Admitting: Obstetrics and Gynecology

## 2023-01-02 ENCOUNTER — Inpatient Hospital Stay (HOSPITAL_COMMUNITY): Payer: Medicaid Other

## 2023-01-02 ENCOUNTER — Other Ambulatory Visit: Payer: Self-pay

## 2023-01-02 DIAGNOSIS — O9972 Diseases of the skin and subcutaneous tissue complicating childbirth: Secondary | ICD-10-CM | POA: Diagnosis present

## 2023-01-02 DIAGNOSIS — Z87440 Personal history of urinary (tract) infections: Secondary | ICD-10-CM | POA: Diagnosis not present

## 2023-01-02 DIAGNOSIS — Z8616 Personal history of COVID-19: Secondary | ICD-10-CM | POA: Diagnosis not present

## 2023-01-02 DIAGNOSIS — Z3A39 39 weeks gestation of pregnancy: Secondary | ICD-10-CM

## 2023-01-02 DIAGNOSIS — L304 Erythema intertrigo: Secondary | ICD-10-CM | POA: Diagnosis present

## 2023-01-02 DIAGNOSIS — O99334 Smoking (tobacco) complicating childbirth: Secondary | ICD-10-CM | POA: Diagnosis present

## 2023-01-02 DIAGNOSIS — Z87442 Personal history of urinary calculi: Secondary | ICD-10-CM | POA: Diagnosis not present

## 2023-01-02 DIAGNOSIS — O403XX0 Polyhydramnios, third trimester, not applicable or unspecified: Secondary | ICD-10-CM | POA: Diagnosis present

## 2023-01-02 DIAGNOSIS — O0993 Supervision of high risk pregnancy, unspecified, third trimester: Secondary | ICD-10-CM

## 2023-01-02 DIAGNOSIS — O26893 Other specified pregnancy related conditions, third trimester: Secondary | ICD-10-CM | POA: Diagnosis present

## 2023-01-02 DIAGNOSIS — F17210 Nicotine dependence, cigarettes, uncomplicated: Secondary | ICD-10-CM | POA: Diagnosis present

## 2023-01-02 DIAGNOSIS — O99214 Obesity complicating childbirth: Secondary | ICD-10-CM | POA: Diagnosis present

## 2023-01-02 DIAGNOSIS — Z6791 Unspecified blood type, Rh negative: Secondary | ICD-10-CM

## 2023-01-02 DIAGNOSIS — Z7982 Long term (current) use of aspirin: Secondary | ICD-10-CM | POA: Diagnosis not present

## 2023-01-02 DIAGNOSIS — O409XX0 Polyhydramnios, unspecified trimester, not applicable or unspecified: Principal | ICD-10-CM | POA: Diagnosis present

## 2023-01-02 LAB — CBC
HCT: 35.2 % — ABNORMAL LOW (ref 36.0–46.0)
Hemoglobin: 12.1 g/dL (ref 12.0–15.0)
MCH: 30.7 pg (ref 26.0–34.0)
MCHC: 34.4 g/dL (ref 30.0–36.0)
MCV: 89.3 fL (ref 80.0–100.0)
Platelets: 260 10*3/uL (ref 150–400)
RBC: 3.94 MIL/uL (ref 3.87–5.11)
RDW: 13.6 % (ref 11.5–15.5)
WBC: 11.1 10*3/uL — ABNORMAL HIGH (ref 4.0–10.5)
nRBC: 0 % (ref 0.0–0.2)

## 2023-01-02 LAB — TYPE AND SCREEN
ABO/RH(D): O POS
Antibody Screen: NEGATIVE

## 2023-01-02 LAB — RPR: RPR Ser Ql: NONREACTIVE

## 2023-01-02 MED ORDER — SOD CITRATE-CITRIC ACID 500-334 MG/5ML PO SOLN: 30.0000 mL | ORAL | Status: DC | PRN

## 2023-01-02 MED ORDER — LIDOCAINE HCL (PF) 1 % IJ SOLN: 30.0000 mL | INTRAMUSCULAR | Status: DC | PRN

## 2023-01-02 MED ORDER — FENTANYL CITRATE (PF) 100 MCG/2ML IJ SOLN
100.0000 ug | INTRAMUSCULAR | Status: DC | PRN
  Administered 2023-01-03: 100 ug via INTRAVENOUS
  Filled 2023-01-02: qty 2

## 2023-01-02 MED ORDER — MISOPROSTOL 50MCG HALF TABLET
50.0000 ug | ORAL_TABLET | Freq: Once | ORAL | Status: AC
  Administered 2023-01-03: 50 ug via ORAL
  Filled 2023-01-02: qty 1

## 2023-01-02 MED ORDER — OXYTOCIN-SODIUM CHLORIDE 30-0.9 UT/500ML-% IV SOLN
2.5000 [IU]/h | INTRAVENOUS | Status: DC
  Filled 2023-01-02: qty 500

## 2023-01-02 MED ORDER — MISOPROSTOL 50MCG HALF TABLET
50.0000 ug | ORAL_TABLET | Freq: Once | ORAL | Status: AC
  Administered 2023-01-02: 50 ug via ORAL
  Filled 2023-01-02: qty 1

## 2023-01-02 MED ORDER — OXYCODONE-ACETAMINOPHEN 5-325 MG PO TABS: 2.0000 | ORAL_TABLET | ORAL | Status: DC | PRN

## 2023-01-02 MED ORDER — OXYTOCIN BOLUS FROM INFUSION
333.0000 mL | Freq: Once | INTRAVENOUS | Status: DC
  Administered 2023-01-03: 333 mL via INTRAVENOUS

## 2023-01-02 MED ORDER — TERBUTALINE SULFATE 1 MG/ML IJ SOLN: 0.2500 mg | Freq: Once | INTRAMUSCULAR | Status: DC | PRN

## 2023-01-02 MED ORDER — ONDANSETRON HCL 4 MG/2ML IJ SOLN
4.0000 mg | Freq: Four times a day (QID) | INTRAMUSCULAR | Status: DC | PRN
  Administered 2023-01-03: 4 mg via INTRAVENOUS
  Filled 2023-01-02: qty 2

## 2023-01-02 MED ORDER — MISOPROSTOL 50MCG HALF TABLET
50.0000 ug | ORAL_TABLET | Freq: Once | ORAL | Status: AC
  Administered 2023-01-02: 50 ug via BUCCAL
  Filled 2023-01-02: qty 1

## 2023-01-02 MED ORDER — OXYCODONE-ACETAMINOPHEN 5-325 MG PO TABS: 1.0000 | ORAL_TABLET | ORAL | Status: DC | PRN

## 2023-01-02 MED ORDER — MISOPROSTOL 25 MCG QUARTER TABLET
25.0000 ug | ORAL_TABLET | ORAL | Status: DC
  Administered 2023-01-02: 25 ug via VAGINAL
  Filled 2023-01-02 (×2): qty 1

## 2023-01-02 MED ORDER — ACETAMINOPHEN 325 MG PO TABS: 650.0000 mg | ORAL_TABLET | ORAL | Status: DC | PRN

## 2023-01-02 MED ORDER — FLEET ENEMA RE ENEM: 1.0000 | ENEMA | RECTAL | Status: DC | PRN

## 2023-01-02 MED ORDER — LACTATED RINGERS IV SOLN: 500.0000 mL | INTRAVENOUS | Status: DC | PRN

## 2023-01-02 MED ORDER — MISOPROSTOL 25 MCG QUARTER TABLET
25.0000 ug | ORAL_TABLET | Freq: Once | ORAL | Status: AC
  Administered 2023-01-02: 25 ug via VAGINAL
  Filled 2023-01-02: qty 1

## 2023-01-02 MED ORDER — HYDROXYZINE HCL 50 MG PO TABS: 50.0000 mg | ORAL_TABLET | Freq: Four times a day (QID) | ORAL | Status: DC | PRN

## 2023-01-02 MED ORDER — LACTATED RINGERS IV SOLN
INTRAVENOUS | Status: DC
  Administered 2023-01-02 (×2): 125 mL/h via INTRAVENOUS

## 2023-01-02 NOTE — Progress Notes (Deleted)
OBSTETRIC ADMISSION HISTORY AND PHYSICAL  TWISHA VANPELT is a 27 y.o. female G3P1011 with IUP at [redacted]w[redacted]d by LMP presenting for IOL for polyhydramnios. She reports +FMs, no LOF, no VB, no blurry vision, headaches or peripheral edema, and RUQ pain.  She plans on breast feeding. She will be using Condoms and lactational amenorrhea for birth control, does not want anything right now.  Wants a circumcision.  She received her prenatal care at [facility].  Dating: By LMP c/w [place] @ [redacted]w[redacted]d --->  Estimated Date of Delivery: 01/09/23 Sono:  @[redacted]w[redacted]d , normal anatomy, breech presentation, anterior placenta, 336g, 62% EFW  Subjective: UTI on cephalexin 9/23, did not finish medication course.  Found it difficult to take medicine 4 times a day. Not feeling contractions yet.  Doing well.  Prenatal History/Complications: Polyhydramnios, BMI>40, tobacco use  Past Medical History: Past Medical History:  Diagnosis Date   Chlamydia 06/11/2016   COVID 11/04/2022   Kidney stone    Shingles 2018   Vaginal discharge 12/21/2013    Past Surgical History: History reviewed. No pertinent surgical history.  Obstetrical History: OB History     Gravida  3   Para  1   Term  1   Preterm      AB  1   Living  1      SAB      IAB  1   Ectopic      Multiple      Live Births  1           Social History Social History   Socioeconomic History   Marital status: Single    Spouse name: Not on file   Number of children: 1   Years of education: Not on file   Highest education level: Not on file  Occupational History   Not on file  Tobacco Use   Smoking status: Every Day    Current packs/day: 0.50    Average packs/day: 0.7 packs/day for 17.8 years (11.9 ttl pk-yrs)    Types: Cigarettes    Start date: 2013   Smokeless tobacco: Never  Vaping Use   Vaping status: Never Used  Substance and Sexual Activity   Alcohol use: Not Currently    Comment: sometimes   Drug use: No   Sexual activity:  Yes    Birth control/protection: None  Other Topics Concern   Not on file  Social History Narrative   Not on file   Social Determinants of Health   Financial Resource Strain: Low Risk  (07/07/2022)   Overall Financial Resource Strain (CARDIA)    Difficulty of Paying Living Expenses: Not hard at all  Food Insecurity: No Food Insecurity (07/07/2022)   Hunger Vital Sign    Worried About Running Out of Food in the Last Year: Never true    Ran Out of Food in the Last Year: Never true  Transportation Needs: No Transportation Needs (07/07/2022)   PRAPARE - Administrator, Civil Service (Medical): No    Lack of Transportation (Non-Medical): No  Physical Activity: Sufficiently Active (07/07/2022)   Exercise Vital Sign    Days of Exercise per Week: 5 days    Minutes of Exercise per Session: 30 min  Stress: No Stress Concern Present (07/07/2022)   [employer] of Occupational Health - Occupational Stress Questionnaire    Feeling of Stress : Not at all  Social Connections: Socially Isolated (07/07/2022)   Social Connection and Isolation Panel [NHANES]    Frequency  of Communication with Friends and Family: More than three times a week    Frequency of Social Gatherings with Friends and Family: More than three times a week    Attends Religious Services: Never    Database administrator or Organizations: No    Attends Engineer, structural: Never    Marital Status: Never married    Family History: Family History  Problem Relation Age of Onset   Cancer Maternal Grandmother        breast, lung   Cancer Maternal Grandfather        pancreatic    Allergies: No Known Allergies  Medications Prior to Admission  Medication Sig Dispense Refill Last Dose   aspirin EC 81 MG tablet Take 1 tablet (81 mg total) by mouth daily. Swallow whole. 90 tablet 3    Blood Pressure Monitoring (BLOOD PRESSURE CUFF) MISC 1 Device by Does not apply route as directed. 1 each 0    cephALEXin  (KEFLEX) 500 MG capsule Take 1 capsule (500 mg total) by mouth 4 (four) times daily. X 7 days (Patient not taking: Reported on 12/30/2022) 28 capsule 0    prenatal vitamin w/FE, FA (PRENATAL 1 + 1) 27-1 MG TABS tablet Take 1 tablet by mouth daily 90 tablet 3     Review of Systems  All systems reviewed and negative except as stated in HPI.  Blood pressure 113/71, pulse 89, temperature 98.2 F (36.8 C), temperature source Oral, resp. rate 18, height 5\' 1"  (1.549 m), weight 98.4 kg, last menstrual period 04/04/2022. General appearance: alert, cooperative, appears stated age, and mildly obese Lungs: clear to auscultation bilaterally Heart: regular rate and rhythm Abdomen: soft, non-tender; bowel sounds normal Pelvic: No vaginal leakage, no bleeding. Extremities: Homans sign is negative, no sign of DVT DTR's: 2+ brachial Presentation: cephalic  Fetal monitoring: Baseline: 155 bpm, Variability: Good {> 6 bpm), Accelerations: Reactive, and Decelerations: Absent Uterine activity: Every 2 minutes  Dilation: 1 Effacement (%): Thick Cervical Position: Posterior Station: Ballotable Presentation: Vertex (confirmed by bedside US Erven Colla RN) Exam by:: Luz Brazen RN  Prenatal labs: ABO, Rh: --/--/O POS (10/04 1000) Antibody: NEG (10/04 1000) Rubella: 1.95 (04/08 1057) RPR: NON REACTIVE (10/04 1000)  HBsAg: Negative (04/08 1057)  HIV: Non Reactive (07/17 0854)  GBS: Negative/-- (09/17 0156)   NURSING  PROVIDER  Office Location [facility] Dating by LMP c/w U/S at [redacted]w[redacted]d wks  [facility] Model Traditional Anatomy U/S Normal female  Initiated care at  13wks                 Language  English               LAB RESULTS   Support Person Saquan Hooper Genetics NIPS:  LR female           AFP:                  NT/IT (FT only) neg      Carrier Screen Horizon: neg  Rhogam  O/Positive/-- (04/08 1057) A1C/GTT Early:  5.4           Third trimester:  Flu Vaccine        TDaP Vaccine  12/16/22  Blood Type  O/Positive/-- (04/08 1057)  Covid Vaccine   Antibody Negative (04/08 1057)      Rubella 1.95 (04/08 1057)  Feeding Plan both RPR Non Reactive (04/08 1057)  Contraception declines HBsAg Negative (04/08 1057)  Circumcision yes HIV Non Reactive (  04/08 1057)  Pediatrician  [facility] HCVAb Non Reactive (04/08 1057)  Prenatal Classes            Pap 07/28/22: neg  BTLConsent   GC/CT Initial:  -/-           36wks:  -/-  VBAC  Consent   GBS  neg For PCN allergy, check sensitivities            DME Rx [x]  BP cuff [ ]  Weight Scale Waterbirth  [ ]  Class [ ]  Consent [ ]  CNM visit  PHQ9 & GAD7 [  ] new OB [  ] 28 weeks  [  ] 36 weeks Induction  [ ]  Orders Entered [ ] Foley Y/N   Prenatal Transfer Tool  Maternal Diabetes: No Genetic Screening: Normal Maternal Ultrasounds/Referrals: Normal Fetal Ultrasounds or other Referrals:  Referred to Materal Fetal Medicine  Maternal Substance Abuse:  Yes:  Type: Smoker Significant Maternal Medications:  None Significant Maternal Lab Results: Group B Strep negative and Rh negative  Results for orders placed or performed during the hospital encounter of 01/02/23 (from the past 24 hour(s))  RPR   Collection Time: 01/02/23 10:00 AM  Result Value Ref Range   RPR Ser Ql NON REACTIVE NON REACTIVE  CBC   Collection Time: 01/02/23 10:00 AM  Result Value Ref Range   WBC 11.1 (H) 4.0 - 10.5 K/uL   RBC 3.94 3.87 - 5.11 MIL/uL   Hemoglobin 12.1 12.0 - 15.0 g/dL   HCT 16.1 (L) 09.6 - 04.5 %   MCV 89.3 80.0 - 100.0 fL   MCH 30.7 26.0 - 34.0 pg   MCHC 34.4 30.0 - 36.0 g/dL   RDW 40.9 81.1 - 91.4 %   Platelets 260 150 - 400 K/uL   nRBC 0.0 0.0 - 0.2 %  Type and screen   Collection Time: 01/02/23 10:00 AM  Result Value Ref Range   ABO/RH(D) O POS    Antibody Screen NEG    Sample Expiration      01/05/2023,2359 Performed at [facility], [address] [place], [place] [address]     Patient Active Problem List   Diagnosis Date Noted   Polyhydramnios  11/12/2022   Obesity, Class III, BMI 40-49.9 (morbid obesity) (HCC) 09/18/2022   Asymptomatic bacteriuria during pregnancy in first trimester 07/14/2022   Supervision of other high risk pregnancy, antepartum 07/07/2022   Smoker 02/11/2018    Assessment/Plan:  MAYSEN BONSIGNORE is a 27 y.o. G3P1011 at [redacted]w[redacted]d here for IOL for polyhydramnios.  #Labor: Augmentation planned with Cytotec #Pain: IV fentanyl PRN, plan for epidural later #Fetal Well Being: Category I tracing, frequent contractions #ID: Group B Strep negative and Rh negative #Method Of Feeding: breast feeding #Method Of Contraception: natural family planning (NFP) #Circ: yes #Hx UTI: Culture positive for klebsiella @ [redacted]w[redacted]d, not fully treated for UTI due to poor medication adherence, will follow up after delivery.  Basil Blakesley Sharion Dove, MD

## 2023-01-02 NOTE — H&P (Cosign Needed Addendum)
OBSTETRIC ADMISSION HISTORY AND PHYSICAL  Valerie Greene is a 27 y.o. female G3P1011 with IUP at [redacted]w[redacted]d by LMP presenting for IOL for polyhydramnios. She reports +FMs, no LOF, no VB, no blurry vision, headaches or peripheral edema, and RUQ pain.  She plans on breast feeding. She will be using Condoms and lactational amenorrheafor birth control, does not want anything right now.  Wants a circumcision.  She received her prenatal care at [facility].  Dating: By LMP c/w [place] @ [redacted]w[redacted]d --->  Estimated Date of Delivery: 01/09/23 Sono:  @[redacted]w[redacted]d , normal anatomy, breech presentation, anterior placenta, 336g, 62% EFW  Subjective: UTI on cephalexin 9/23, did not finish medication course.  Found it difficult to take medicine 4 times a day. Not feeling contractions yet.  Doing well.  Prenatal History/Complications: Polyhydramnios, BMI>40, tobacco use  Past Medical History: Past Medical History:  Diagnosis Date   Chlamydia 06/11/2016   COVID 11/04/2022   Kidney stone    Shingles 2018   Vaginal discharge 12/21/2013    Past Surgical History: History reviewed. No pertinent surgical history.  Obstetrical History: OB History     Gravida  3   Para  1   Term  1   Preterm      AB  1   Living  1      SAB      IAB  1   Ectopic      Multiple      Live Births  1           Social History Social History   Socioeconomic History   Marital status: Single    Spouse name: Not on file   Number of children: 1   Years of education: Not on file   Highest education level: Not on file  Occupational History   Not on file  Tobacco Use   Smoking status: Every Day    Current packs/day: 0.50    Average packs/day: 0.7 packs/day for 17.8 years (11.9 ttl pk-yrs)    Types: Cigarettes    Start date: 2013   Smokeless tobacco: Never  Vaping Use   Vaping status: Never Used  Substance and Sexual Activity   Alcohol use: Not Currently    Comment: sometimes   Drug use: No   Sexual activity: Yes     Birth control/protection: None  Other Topics Concern   Not on file  Social History Narrative   Not on file   Social Determinants of Health   Financial Resource Strain: Low Risk  (07/07/2022)   Overall Financial Resource Strain (CARDIA)    Difficulty of Paying Living Expenses: Not hard at all  Food Insecurity: No Food Insecurity (07/07/2022)   Hunger Vital Sign    Worried About Running Out of Food in the Last Year: Never true    Ran Out of Food in the Last Year: Never true  Transportation Needs: No Transportation Needs (07/07/2022)   PRAPARE - Administrator, Civil Service (Medical): No    Lack of Transportation (Non-Medical): No  Physical Activity: Sufficiently Active (07/07/2022)   Exercise Vital Sign    Days of Exercise per Week: 5 days    Minutes of Exercise per Session: 30 min  Stress: No Stress Concern Present (07/07/2022)   [employer] of Occupational Health - Occupational Stress Questionnaire    Feeling of Stress : Not at all  Social Connections: Socially Isolated (07/07/2022)   Social Connection and Isolation Panel [NHANES]    Frequency of  Communication with Friends and Family: More than three times a week    Frequency of Social Gatherings with Friends and Family: More than three times a week    Attends Religious Services: Never    Database administrator or Organizations: No    Attends Engineer, structural: Never    Marital Status: Never married    Family History: Family History  Problem Relation Age of Onset   Cancer Maternal Grandmother        breast, lung   Cancer Maternal Grandfather        pancreatic    Allergies: No Known Allergies  Medications Prior to Admission  Medication Sig Dispense Refill Last Dose   aspirin EC 81 MG tablet Take 1 tablet (81 mg total) by mouth daily. Swallow whole. 90 tablet 3    Blood Pressure Monitoring (BLOOD PRESSURE CUFF) MISC 1 Device by Does not apply route as directed. 1 each 0    cephALEXin (KEFLEX)  500 MG capsule Take 1 capsule (500 mg total) by mouth 4 (four) times daily. X 7 days (Patient not taking: Reported on 12/30/2022) 28 capsule 0    prenatal vitamin w/FE, FA (PRENATAL 1 + 1) 27-1 MG TABS tablet Take 1 tablet by mouth daily 90 tablet 3     Review of Systems  All systems reviewed and negative except as stated in HPI.  Blood pressure 113/71, pulse 89, temperature 98.2 F (36.8 C), temperature source Oral, resp. rate 18, height 5\' 1"  (1.549 m), weight 98.4 kg, last menstrual period 04/04/2022. General appearance: alert, cooperative, appears stated age, and mildly obese Lungs: clear to auscultation bilaterally Heart: regular rate and rhythm Abdomen: soft, non-tender; bowel sounds normal Pelvic: No vaginal leakage, no bleeding. Extremities: Homans sign is negative, no sign of DVT DTR's: 2+ brachial Presentation: cephalic  Fetal monitoring: Baseline: 155 bpm, Variability: Good {> 6 bpm), Accelerations: Reactive, and Decelerations: Absent Uterine activity: Every 2 minutes  Dilation: 1 Effacement (%): Thick Cervical Position: Posterior Station: Ballotable Presentation: Vertex (confirmed by bedside US Valerie Greene) Exam by:: Valerie Greene  Prenatal labs: ABO, Rh: --/--/O POS (10/04 1000) Antibody: NEG (10/04 1000) Rubella: 1.95 (04/08 1057) RPR: NON REACTIVE (10/04 1000)  HBsAg: Negative (04/08 1057)  HIV: Non Reactive (07/17 0854)  GBS: Negative/-- (09/17 0156)   NURSING  PROVIDER  Office Location [facility] Dating by LMP c/w U/S at [redacted]w[redacted]d wks  [facility] Model Traditional Anatomy U/S Normal female  Initiated care at  13wks                 Language  English               LAB RESULTS   Support Person Valerie Greene Genetics NIPS:  LR female           AFP:                  NT/IT (FT only) neg      Carrier Screen Horizon: neg  Rhogam  O/Positive/-- (04/08 1057) A1C/GTT Early:  5.4           Third trimester:  Flu Vaccine        TDaP Vaccine  12/16/22  Blood Type  O/Positive/-- (04/08 1057)  Covid Vaccine   Antibody Negative (04/08 1057)      Rubella 1.95 (04/08 1057)  Feeding Plan both RPR Non Reactive (04/08 1057)  Contraception declines HBsAg Negative (04/08 1057)  Circumcision yes HIV Non Reactive (04/08  1057)  Pediatrician  [facility] HCVAb Non Reactive (04/08 1057)  Prenatal Classes            Pap 07/28/22: neg  BTLConsent   GC/CT Initial:  -/-           36wks:  -/-  VBAC  Consent   GBS  neg For PCN allergy, check sensitivities            DME Rx [x]  BP cuff [ ]  Weight Scale Waterbirth  [ ]  Class [ ]  Consent [ ]  CNM visit  PHQ9 & GAD7 [  ] new OB [  ] 28 weeks  [  ] 36 weeks Induction  [ ]  Orders Entered [ ] Foley Y/N   Prenatal Transfer Tool  Maternal Diabetes: No Genetic Screening: Normal Maternal Ultrasounds/Referrals: Normal Fetal Ultrasounds or other Referrals:  Referred to Materal Fetal Medicine  Maternal Substance Abuse:  Yes:  Type: Smoker Significant Maternal Medications:  None Significant Maternal Lab Results: Group B Strep negative and Rh negative  Results for orders placed or performed during the hospital encounter of 01/02/23 (from the past 24 hour(s))  RPR   Collection Time: 01/02/23 10:00 AM  Result Value Ref Range   RPR Ser Ql NON REACTIVE NON REACTIVE  CBC   Collection Time: 01/02/23 10:00 AM  Result Value Ref Range   WBC 11.1 (H) 4.0 - 10.5 K/uL   RBC 3.94 3.87 - 5.11 MIL/uL   Hemoglobin 12.1 12.0 - 15.0 g/dL   HCT 82.9 (L) 56.2 - 13.0 %   MCV 89.3 80.0 - 100.0 fL   MCH 30.7 26.0 - 34.0 pg   MCHC 34.4 30.0 - 36.0 g/dL   RDW 86.5 78.4 - 69.6 %   Platelets 260 150 - 400 K/uL   nRBC 0.0 0.0 - 0.2 %  Type and screen   Collection Time: 01/02/23 10:00 AM  Result Value Ref Range   ABO/RH(D) O POS    Antibody Screen NEG    Sample Expiration      01/05/2023,2359 Performed at [facility], [address] [place], [place] [address]     Patient Active Problem List   Diagnosis Date Noted   Polyhydramnios  11/12/2022   Obesity, Class III, BMI 40-49.9 (morbid obesity) (HCC) 09/18/2022   Asymptomatic bacteriuria during pregnancy in first trimester 07/14/2022   Supervision of other high risk pregnancy, antepartum 07/07/2022   Smoker 02/11/2018    Assessment/Plan:  Valerie Greene is a 27 y.o. G3P1011 at [redacted]w[redacted]d here for IOL for polyhydramnios.  #Labor: Augmentation planned with Cytotec #Pain: IV fentanyl PRN, plan for epidural later #Fetal Well Being: Category I tracing, frequent contractions #ID: Group B Strep negative and Rh negative #Method Of Feeding: breast feeding #Method Of Contraception: natural family planning (NFP) #Circ: yes #Hx UTI: Culture positive for klebsiella @ [redacted]w[redacted]d, not fully treated for UTI due to poor medication adherence, will follow up after delivery.  Dimitry Sharion Dove, MD  Midwife attestation: I have seen and examined this patient; I agree with above documentation in the resident's note.   Valerie Greene is a 27 y.o. G3P1011 here for IOL for polyhydramnios.  PE: BP 110/75 (BP Location: Left Arm)   Pulse 87   Temp 98 F (36.7 C) (Oral)   Resp 16   Ht 5\' 1"  (1.549 m)   Wt 217 lb (98.4 kg)   LMP 04/04/2022   BMI 41.00 kg/m  Gen: calm comfortable, NAD Resp: normal effort, no distress Abd: gravid  ROS, labs, PMH reviewed  Plan: Admit to LD Labor: Cytotec Fetal monitoring: Category I ID: GBS neg  Sharen Counter, CNM  01/02/2023, 10:11 PM

## 2023-01-03 ENCOUNTER — Inpatient Hospital Stay (HOSPITAL_COMMUNITY): Payer: Medicaid Other | Admitting: Anesthesiology

## 2023-01-03 DIAGNOSIS — O99334 Smoking (tobacco) complicating childbirth: Secondary | ICD-10-CM

## 2023-01-03 DIAGNOSIS — Z3A39 39 weeks gestation of pregnancy: Secondary | ICD-10-CM

## 2023-01-03 DIAGNOSIS — O403XX0 Polyhydramnios, third trimester, not applicable or unspecified: Secondary | ICD-10-CM

## 2023-01-03 MED ORDER — EPHEDRINE 5 MG/ML INJ: 10.0000 mg | INTRAVENOUS | Status: DC | PRN

## 2023-01-03 MED ORDER — DIPHENHYDRAMINE HCL 50 MG/ML IJ SOLN: 12.5000 mg | INTRAMUSCULAR | Status: DC | PRN

## 2023-01-03 MED ORDER — PHENYLEPHRINE 80 MCG/ML (10ML) SYRINGE FOR IV PUSH (FOR BLOOD PRESSURE SUPPORT): 80.0000 ug | PREFILLED_SYRINGE | INTRAVENOUS | Status: DC | PRN

## 2023-01-03 MED ORDER — FENTANYL-BUPIVACAINE-NACL 0.5-0.125-0.9 MG/250ML-% EP SOLN
12.0000 mL/h | EPIDURAL | Status: DC | PRN
  Administered 2023-01-03: 12 mL/h via EPIDURAL
  Filled 2023-01-03: qty 250

## 2023-01-03 MED ORDER — MISOPROSTOL 25 MCG QUARTER TABLET
25.0000 ug | ORAL_TABLET | Freq: Once | ORAL | Status: AC
  Administered 2023-01-03: 25 ug via BUCCAL
  Filled 2023-01-03: qty 1

## 2023-01-03 MED ORDER — OXYTOCIN-SODIUM CHLORIDE 30-0.9 UT/500ML-% IV SOLN
1.0000 m[IU]/min | INTRAVENOUS | Status: DC
  Administered 2023-01-03: 2 m[IU]/min via INTRAVENOUS

## 2023-01-03 MED ORDER — TERBUTALINE SULFATE 1 MG/ML IJ SOLN: 0.2500 mg | Freq: Once | INTRAMUSCULAR | Status: DC | PRN

## 2023-01-03 MED ORDER — LIDOCAINE HCL (PF) 1 % IJ SOLN
INTRAMUSCULAR | Status: DC | PRN
  Administered 2023-01-03 (×2): 4 mL via EPIDURAL

## 2023-01-03 MED ORDER — LACTATED RINGERS IV SOLN: 500.0000 mL | Freq: Once | INTRAVENOUS | Status: DC

## 2023-01-03 NOTE — Progress Notes (Signed)
Labor Progress Note Valerie Greene is a 27 y.o. G3P1011 at [redacted]w[redacted]d presented for IOL related to polyhydramnios.   S:  Feeling better after eating breakfast, cramping but minimal discomfort overall. Eagerly anticipating shower and birth of baby boy.  O:  BP 113/60   Pulse 82   Temp 97.8 F (36.6 C) (Oral)   Resp 16   Ht 5\' 1"  (1.549 m)   Wt 98.4 kg   LMP 04/04/2022   SpO2 99%   BMI 41.00 kg/m  EFM: baseline 145 bpm/ moderate variability/ 15x15 accels/ absent decels  Toco/IUPC: irregular contractions SVE: Dilation: 2 Effacement (%): 70 Cervical Position: Posterior Station: -1 Presentation: Vertex Exam by:: Joycie Peek [place] Pitocin: 0 mu/min  A/P: 27 y.o. G3P1011 [redacted]w[redacted]d  1. Labor: IOL in latent labor s/p cytotec x5 doses. RBA of foley balloon discussed. Patient desires to proceed with placement of foley balloon, patient tolerated procedure well. 60mL fluid placed in balloon. Tension applied. Consider AROM once foley balloon out following epidural placement.  2. FWB: Cat 1 3. Pain: Comfortable now, epidural on request.  4. GBS negative  Anticipate NVSB.  Richardson Landry, CNM 9:42 AM

## 2023-01-03 NOTE — Progress Notes (Signed)
Labor Progress Note BALEY SHANDS is a 27 y.o. G3P1011 at [redacted]w[redacted]d presented for induction of labor secondary to polyhydramnios. S: Feeling well s/p epidural catheter placement. Continues feeling contractions, tolerating.   O:  BP (!) 98/56   Pulse 67   Temp 97.8 F (36.6 C) (Oral)   Resp 16   Ht 5\' 1"  (1.549 m)   Wt 98.4 kg   LMP 04/04/2022   SpO2 99%   BMI 41.00 kg/m  EFM: Baseline 145bpm, moderate variability, accels present, variable decels present, contractions Q1-2 min  CVE: Dilation: 4.5 Effacement (%): 80 Cervical Position: Posterior Station: -1 Presentation: Vertex Exam by:: Lamont Snowball   A&P: 27 y.o. [id number] [redacted]w[redacted]d admitted for IOL secondary to polyhydramnios #Labor: Progressing well in the latent phase of labor. She is s/p vaginal and oral cytotec, foley balloon expulsion and AROM with large amount of clear fluid. Will augment labor with pitocin 2x2 as able. Anticipate progression to active labor and NSVD #Pain: Coping well with contractions, now epidural catheter in place #FWB: Cat II tracing #GBS negative   Charma Igo, MD 9:24 PM

## 2023-01-03 NOTE — Progress Notes (Addendum)
Labor Progress Note Valerie Greene is a 27 y.o. G3P1011 at [redacted]w[redacted]d presented for IOL 2/2 polyhydramnios.  S:  Feeling well after expulsion of foley balloon.   O:  BP 113/60   Pulse 82   Temp 97.8 F (36.6 C) (Oral)   Resp 16   Ht 5\' 1"  (1.549 m)   Wt 98.4 kg   LMP 04/04/2022   SpO2 99%   BMI 41.00 kg/m  EFM: baseline 150 bpm/ moderate variability/ 15x15 accels/ occasional variable  decels  Toco/IUPC: 3-8 mins  SVE: Dilation: 4.5 Effacement (%): 80 Cervical Position: Posterior Station: -1 Presentation: Vertex Exam by:: Joycie Peek Hill Pitocin: 0 mu/min  A/P: 27 y.o. G3P1011 [redacted]w[redacted]d  1. Labor: Progressing well with IOL in latent phase of labor. S/p foley balloon expulsion. RBA of AROM discussed and patient desires to proceed with AROM. Tolerated procedure well, large amount of clear fluid noted. Antipate progress to active labor shortly. Consider augmentation with pitocin dependent on dilation progress.  2. FWB: Cat 1  3. Pain: Coping well now with contractions. Epidural on request.  4. GBS neg   Anticipate NVSB.  Richardson Landry, CNM 3:01 PM

## 2023-01-03 NOTE — Discharge Summary (Signed)
Postpartum Discharge Summary  Date of Service updated-10/7     Patient Name: Valerie Greene DOB: 27-Nov-1995 MRN: [id number]  Date of admission: 01/02/2023 Delivery date:01/03/2023 Delivering provider: Sundra Aland Date of discharge: 01/05/2023  Admitting diagnosis: Polyhydramnios [O40.9XX0] Intrauterine pregnancy: [redacted]w[redacted]d     Secondary diagnosis:  Principal Problem:   SVD (spontaneous vaginal delivery) Active Problems:   Polyhydramnios Intertrigo  Additional problems: none    Discharge diagnosis: Term Pregnancy Delivered and Polyhydramnios                                               Post partum procedures: none Augmentation: AROM, Pitocin, Cytotec, and IP Foley Complications: None  Hospital course: Induction of Labor With Vaginal Delivery   27 y.o. yo [id number] at [redacted]w[redacted]d was admitted to the hospital 01/02/2023 for induction of labor.  Indication for induction:  polyhydramnios .  Patient had an uncomplicated labor course. Membrane Rupture Time/Date: 2:53 PM,01/03/2023  Delivery Method:Vaginal, Spontaneous Operative Delivery:N/A Episiotomy: None Lacerations:  1st degree Details of delivery can be found in separate delivery note.  Patient had a postpartum course that was uncomplicated. Patient is discharged home 01/05/23. Pt sent home with intertrigo to help with rash on inner thigh.  Newborn Data: Birth date:01/03/2023 Birth time:11:13 PM Gender:Female Living status:Living Apgars:7 ,9  Weight:3770 g  Magnesium Sulfate received: No BMZ received: No Rhophylac:N/A MMR:N/A T-DaP:Given prenatally Flu: N/A RSV Vaccine received: No Transfusion:No  Immunizations received: Immunization History  Administered Date(s) Administered   Tdap 11/26/2013, 12/16/2022    Physical exam  Vitals:   01/04/23 1120 01/04/23 1540 01/04/23 2036 01/05/23 0424  BP: 104/69 101/66 112/71 95/60  Pulse: 89 76 77 74  Resp: 16  18 16   Temp: 97.7 F (36.5 C) 98 F (36.7 C) 97.9 F (36.6  C) 98.2 F (36.8 C)  TempSrc: Oral Oral Oral   SpO2: 99% 100% 99% 98%  Weight:      Height:       General: alert, cooperative, and no distress Lochia: appropriate Uterine Fundus: firm Incision: N/A DVT Evaluation: No evidence of DVT seen on physical exam. Skin: hyperkeratosis with erythema noted- inner thighs- left>right Labs: Lab Results  Component Value Date   WBC 11.1 (H) 01/02/2023   HGB 12.1 01/02/2023   HCT 35.2 (L) 01/02/2023   MCV 89.3 01/02/2023   PLT 260 01/02/2023      Latest Ref Rng & Units 06/10/2019   10:57 PM  CMP  Glucose 70 - 99 mg/dL 92   BUN 6 - 20 mg/dL 14   Creatinine 1.47 - 1.00 mg/dL 8.29   Sodium 562 - 130 mmol/L 136   Potassium 3.5 - 5.1 mmol/L 3.6   Chloride 98 - 111 mmol/L 105   CO2 22 - 32 mmol/L 23   Calcium 8.9 - 10.3 mg/dL 9.2    Edinburgh Score:    01/04/2023    8:19 AM  Edinburgh Postnatal Depression Scale Screening Tool  I have been able to laugh and see the funny side of things. 0  I have looked forward with enjoyment to things. 0  I have blamed myself unnecessarily when things went wrong. 0  I have been anxious or worried for no good reason. 0  I have felt scared or panicky for no good reason. 0  Things have been getting on top of me. 0  I have been so unhappy that I have had difficulty sleeping. 0  I have felt sad or miserable. 0  I have been so unhappy that I have been crying. 0  The thought of harming myself has occurred to me. 0  Edinburgh Postnatal Depression Scale Total 0   Edinburgh Postnatal Depression Scale Total: 0   After visit meds:  Allergies as of 01/05/2023   No Known Allergies      Medication List     STOP taking these medications    aspirin EC 81 MG tablet   cephALEXin 500 MG capsule Commonly known as: KEFLEX       TAKE these medications    acetaminophen 325 MG tablet Commonly known as: Tylenol Take 2 tablets (650 mg total) by mouth every 6 (six) hours as needed for moderate pain (for pain  scale < 4).   Blood Pressure Cuff Misc 1 Device by Does not apply route as directed.   clotrimazole-betamethasone cream Commonly known as: LOTRISONE Apply small amount to affected area twice daily   ibuprofen 800 MG tablet Commonly known as: ADVIL Take 1 tablet (800 mg total) by mouth every 8 (eight) hours.   prenatal vitamin w/FE, FA 27-1 MG Tabs tablet Take 1 tablet by mouth daily         Discharge home in stable condition Infant Feeding: Breast Infant Disposition:home with mother Discharge instruction: per After Visit Summary and Postpartum booklet. Activity: Advance as tolerated. Pelvic rest for 6 weeks.  Diet: routine diet Future Appointments: Future Appointments  Date Time Provider Department Center  02/12/2023 11:10 AM Cheral Marker, CNM [facility]   Follow up Visit:  Follow-up Information     [facility]. Go in 6 week(s).   Specialty: Obstetrics and Gynecology Why: Follow up in 5-6wk as scheduled Contact information: [address] [place] [address] [phone]               Message sent to [facility] 10/5  Please schedule this patient for a In person postpartum visit in 4 weeks with the following provider: Any provider. Additional Postpartum F/U: n/a   Low risk pregnancy complicated by:  polyhydramnios Delivery mode:  Vaginal, Spontaneous Anticipated Birth Control:  not sure- maybe non-hormonal IUD   01/05/2023 Sharon Seller, DO

## 2023-01-03 NOTE — Plan of Care (Signed)

## 2023-01-03 NOTE — Progress Notes (Signed)
Labor Progress Note Valerie Greene is a 28 y.o. G3P1011 at [redacted]w[redacted]d presented for IOL 2/2 polyhydramnios.  S: Pt sleeping in room, not feeling ctx.  O:  BP 99/67   Pulse 78   Temp 98 F (36.7 C) (Oral)   Resp 16   Ht 5\' 1"  (1.549 m)   Wt 98.4 kg   LMP 04/04/2022   BMI 41.00 kg/m  EFM: 135/mod/+a/-d Toco q3-90min  CVE: Dilation: 1.5 Effacement (%): 20, 30 Cervical Position: Posterior Station: Ballotable Presentation: Vertex Exam by:: Lucianne Muss, MD   A&P: 27 y.o. [id number] [redacted]w[redacted]d here for IOL 2/2 polyhydramnios.  #Labor: Cx remains unchanged -- discussed options for attempting FB again vs repeat cytotec, pt declining FB, desires repeat dose of Cytotec, will give PO #Pain: Desires epidural #FWB: Cat I #GBS negative   #Hx UTI: Culture positive for klebsiella @ [redacted]w[redacted]d, not fully treated for UTI due to poor medication adherence, will follow up after delivery.   Sundra Aland, MD 4:33 AM

## 2023-01-03 NOTE — Anesthesia Preprocedure Evaluation (Signed)
Anesthesia Evaluation  Patient identified by MRN, date of birth, ID band  Reviewed: Allergy & Precautions, Patient's Chart, lab work & pertinent test results  History of Anesthesia Complications Negative for: history of anesthetic complications  Airway Mallampati: II  TM Distance: >3 FB Neck ROM: Full    Dental no notable dental hx.    Pulmonary Current Smoker   Pulmonary exam normal        Cardiovascular negative cardio ROS Normal cardiovascular exam     Neuro/Psych negative neurological ROS     GI/Hepatic negative GI ROS, Neg liver ROS,,,  Endo/Other    Morbid obesity  Renal/GU negative Renal ROS  negative genitourinary   Musculoskeletal negative musculoskeletal ROS (+)    Abdominal   Peds  Hematology negative hematology ROS (+)   Anesthesia Other Findings Day of surgery medications reviewed with patient.  Reproductive/Obstetrics (+) Pregnancy                             Anesthesia Physical Anesthesia Plan  ASA: 3  Anesthesia Plan: Epidural   Post-op Pain Management:    Induction:   PONV Risk Score and Plan: Treatment may vary due to age or medical condition  Airway Management Planned: Natural Airway  Additional Equipment: Fetal Monitoring  Intra-op Plan:   Post-operative Plan:   Informed Consent: I have reviewed the patients History and Physical, chart, labs and discussed the procedure including the risks, benefits and alternatives for the proposed anesthesia with the patient or authorized representative who has indicated his/her understanding and acceptance.       Plan Discussed with:   Anesthesia Plan Comments:        Anesthesia Quick Evaluation

## 2023-01-03 NOTE — Anesthesia Procedure Notes (Signed)
Epidural Patient location during procedure: OB Start time: 01/03/2023 8:24 PM End time: 01/03/2023 8:27 PM  Staffing Anesthesiologist: Kaylyn Layer, MD Performed: anesthesiologist   Preanesthetic Checklist Completed: patient identified, IV checked, risks and benefits discussed, monitors and equipment checked, pre-op evaluation and timeout performed  Epidural Patient position: sitting Prep: DuraPrep and site prepped and draped Patient monitoring: continuous pulse ox, blood pressure and heart rate Approach: midline Location: L3-L4 Injection technique: LOR air  Needle:  Needle type: Tuohy  Needle gauge: 17 G Needle length: 9 cm Needle insertion depth: 5.5 cm Catheter type: closed end flexible Catheter size: 19 Gauge Catheter at skin depth: 10 cm Test dose: negative and Other (1% lidocaine)  Assessment Events: blood not aspirated, no cerebrospinal fluid, injection not painful, no injection resistance, no paresthesia and negative IV test  Additional Notes Patient identified. Risks, benefits, and alternatives discussed with patient including but not limited to bleeding, infection, nerve damage, paralysis, failed block, incomplete pain control, headache, blood pressure changes, nausea, vomiting, reactions to medication, itching, and postpartum back pain. Confirmed with bedside nurse the patient's most recent platelet count. Confirmed with patient that they are not currently taking any anticoagulation, have any bleeding history, or any family history of bleeding disorders. Patient expressed understanding and wished to proceed. All questions were answered. Sterile technique was used throughout the entire procedure. Please see nursing notes for vital signs.   Crisp LOR on first pass. Test dose was given through epidural catheter and negative prior to continuing to dose epidural or start infusion. Warning signs of high block given to the patient including shortness of breath,  tingling/numbness in hands, complete motor block, or any concerning symptoms with instructions to call for help. Patient was given instructions on fall risk and not to get out of bed. All questions and concerns addressed with instructions to call with any issues or inadequate analgesia.  Reason for block:procedure for pain

## 2023-01-03 NOTE — Progress Notes (Signed)
Labor Progress Note Valerie Greene is a 27 y.o. G3P1011 at [redacted]w[redacted]d presented for IOL 2/2 polyhydramnios.  S: Not feeling significant discomfort with ctx, no other concerns at this time. Verbally consents to foley placement.  O:  BP 99/67   Pulse 78   Temp 98 F (36.7 C) (Oral)   Resp 16   Ht 5\' 1"  (1.549 m)   Wt 98.4 kg   LMP 04/04/2022   BMI 41.00 kg/m  EFM: 140/mod/+a/-d Toco q2-35min  CVE: Dilation: 1.5 Effacement (%): Thick Cervical Position: Posterior Station: Ballotable Presentation: Vertex Exam by:: Lucianne Muss, MD   A&P: 27 y.o. [id number] [redacted]w[redacted]d here for IOL 2/2 polyhydramnios.  #Labor: Cx essentially unchanged  pt very uncomfortable with cervical exams -- offered IV pain meds prior to placement of FB, but pt declined; attempted to place FB under speculum guidance x 2, unsuccessful and did not reattempt 2/2 discomfort  will repeat Cytotec 50 oral and reassess in 4 hr #Pain: Desires epidural #FWB: Cat I #GBS negative   #Hx UTI: Culture positive for klebsiella @ [redacted]w[redacted]d, not fully treated for UTI due to poor medication adherence, will follow up after delivery.   Sundra Aland, MD 1:34 AM

## 2023-01-03 NOTE — Progress Notes (Signed)
Labor Progress Note Valerie Greene is a 27 y.o. G3P1011 at [redacted]w[redacted]d presented for IOL 2/2 polyhydramnios.   S:  Coping well with c/o minimal cramping with contractions. Supported by partner Katherene Ponto) at bedside.   O:  BP 113/60   Pulse 82   Temp 97.8 F (36.6 C) (Oral)   Resp 16   Ht 5\' 1"  (1.549 m)   Wt 98.4 kg   LMP 04/04/2022   SpO2 99%   BMI 41.00 kg/m  EFM: baseline 145 bpm/ moderate variability/ 15x15 accels/ absent decels  Toco/IUPC: 3-5  SVE: Dilation: 4.5 Effacement (%): 80 Cervical Position: Posterior Station: -1 Presentation: Vertex Exam by:: Joycie Peek Hill Pitocin: 0 mu/min  A/P: 27 y.o. G3P1011 [redacted]w[redacted]d  1. Labor: Slowed progress now in latent phase of labor, s/p cytotec, foley, and AROM. RBA of pitocin, patient wishes to proceed with pitocin administration 2x2. Coping well. Anticipate progression to active phase of labor soon.  2. FWB: Cat 1 3. Pain: Coping well with "crampy" contractions. Epidural on request.  4. GBS neg   Anticipate NVSB.  Richardson Landry, CNM 5:03 PM

## 2023-01-04 ENCOUNTER — Encounter (HOSPITAL_COMMUNITY): Payer: Self-pay | Admitting: Obstetrics & Gynecology

## 2023-01-04 MED ORDER — SODIUM CHLORIDE 0.9% FLUSH: 3.0000 mL | INTRAVENOUS | Status: DC | PRN

## 2023-01-04 MED ORDER — SIMETHICONE 80 MG PO CHEW: 80.0000 mg | CHEWABLE_TABLET | ORAL | Status: DC | PRN

## 2023-01-04 MED ORDER — DIPHENHYDRAMINE HCL 25 MG PO CAPS: 25.0000 mg | ORAL_CAPSULE | Freq: Four times a day (QID) | ORAL | Status: DC | PRN

## 2023-01-04 MED ORDER — IBUPROFEN 800 MG PO TABS
800.0000 mg | ORAL_TABLET | Freq: Three times a day (TID) | ORAL | Status: DC
  Administered 2023-01-04 (×3): 800 mg via ORAL
  Filled 2023-01-04 (×4): qty 1

## 2023-01-04 MED ORDER — PRENATAL MULTIVITAMIN CH
1.0000 | ORAL_TABLET | Freq: Every day | ORAL | Status: DC
  Administered 2023-01-04 – 2023-01-05 (×2): 1 via ORAL
  Filled 2023-01-04 (×2): qty 1

## 2023-01-04 MED ORDER — SODIUM CHLORIDE 0.9% FLUSH: 3.0000 mL | Freq: Two times a day (BID) | INTRAVENOUS | Status: DC

## 2023-01-04 MED ORDER — COCONUT OIL OIL
1.0000 | TOPICAL_OIL | Status: DC | PRN
  Administered 2023-01-05: 1 via TOPICAL

## 2023-01-04 MED ORDER — METHYLERGONOVINE MALEATE 0.2 MG/ML IJ SOLN
INTRAMUSCULAR | Status: AC
  Filled 2023-01-04: qty 1

## 2023-01-04 MED ORDER — SODIUM CHLORIDE 0.9 % IV SOLN: 250.0000 mL | INTRAVENOUS | Status: DC | PRN

## 2023-01-04 MED ORDER — METHYLERGONOVINE MALEATE 0.2 MG/ML IJ SOLN
0.2000 mg | Freq: Once | INTRAMUSCULAR | Status: AC
  Administered 2023-01-04: 0.2 mg via INTRAMUSCULAR

## 2023-01-04 MED ORDER — DIBUCAINE (PERIANAL) 1 % EX OINT: 1.0000 | TOPICAL_OINTMENT | CUTANEOUS | Status: DC | PRN

## 2023-01-04 MED ORDER — ONDANSETRON HCL 4 MG PO TABS: 4.0000 mg | ORAL_TABLET | ORAL | Status: DC | PRN

## 2023-01-04 MED ORDER — BENZOCAINE-MENTHOL 20-0.5 % EX AERO: 1.0000 | INHALATION_SPRAY | CUTANEOUS | Status: DC | PRN

## 2023-01-04 MED ORDER — ONDANSETRON HCL 4 MG/2ML IJ SOLN: 4.0000 mg | INTRAMUSCULAR | Status: DC | PRN

## 2023-01-04 MED ORDER — SENNOSIDES-DOCUSATE SODIUM 8.6-50 MG PO TABS
2.0000 | ORAL_TABLET | ORAL | Status: DC
  Administered 2023-01-04: 2 via ORAL
  Filled 2023-01-04 (×2): qty 2

## 2023-01-04 MED ORDER — ACETAMINOPHEN 325 MG PO TABS: 650.0000 mg | ORAL_TABLET | ORAL | Status: DC | PRN

## 2023-01-04 MED ORDER — WITCH HAZEL-GLYCERIN EX PADS: 1.0000 | MEDICATED_PAD | CUTANEOUS | Status: DC | PRN

## 2023-01-04 NOTE — Lactation Note (Signed)
This note was copied from a baby's chart. Lactation Consultation Note  Patient Name: Valerie Greene [id number] Date: 01/04/2023 Age:29 hours Reason for consult: Initial assessment;Term;1st time breastfeeding  Visited P2 parent for initial consult - 1st time breastfeeding. LC assisted with latch (see latch score). Parent has a pump at home and plans on combo-feeding the baby. LC reviewed breastfeeding basics and parent asked for a hand pump. LC set up hand pump, demonstrated use, and reviewed pumping and milk storage guidelines.  Feeding Plan 1) Breastfeed baby skin to skin every 2-3 hours or sooner if baby cues. 2) Use hand pump for 10-15 minutes post-feeding for stimulation. 3) Call RN/LC for breastfeeding assistance.   Maternal Data Has patient been taught Hand Expression?: Yes Does the patient have breastfeeding experience prior to this delivery?: No  Feeding Mother's Current Feeding Choice: Breast Milk and Formula  LATCH Score Latch: Repeated attempts needed to sustain latch, nipple held in mouth throughout feeding, stimulation needed to elicit sucking reflex.  Audible Swallowing: None  Type of Nipple: Everted at rest and after stimulation  Comfort (Breast/Nipple): Soft / non-tender  Hold (Positioning): Assistance needed to correctly position infant at breast and maintain latch.  LATCH Score: 6   Lactation Tools Discussed/Used Tools: Pump;Flanges Flange Size: 24 Breast pump type: Manual Reason for Pumping: Establish milk supply, parents choice  Interventions Interventions: Breast feeding basics reviewed;Assisted with latch;Breast massage;Skin to skin;Hand express;Breast compression;Reverse pressure;Adjust position;Support pillows;Expressed milk;Position options;Hand pump;Education;LC Services brochure;Guidelines for Milk Supply and Pumping Schedule Handout;CDC Guidelines for Breast Pump Cleaning  Discharge Pump: DEBP;Personal;Hands Free  Consult Status Consult  Status: Follow-up Date: 01/05/23 Follow-up type: In-patient    Antionette Char 01/04/2023, 10:39 AM

## 2023-01-04 NOTE — Anesthesia Postprocedure Evaluation (Signed)
Anesthesia Post Note  Patient: Valerie Greene  Procedure(s) Performed: AN AD HOC LABOR EPIDURAL     Patient location during evaluation: Mother Baby Anesthesia Type: Epidural Level of consciousness: awake and alert Pain management: pain level controlled Vital Signs Assessment: post-procedure vital signs reviewed and stable Respiratory status: spontaneous breathing, nonlabored ventilation and respiratory function stable Cardiovascular status: stable Postop Assessment: no headache, no backache, epidural receding, no apparent nausea or vomiting, patient able to bend at knees, able to ambulate and adequate PO intake Anesthetic complications: no   No notable events documented.  Last Vitals:  Vitals:   01/04/23 0231 01/04/23 0612  BP: (!) 94/59 100/67  Pulse: 74 81  Resp: 16 16  Temp: 36.5 C 36.6 C  SpO2: 98% 98%    Last Pain:  Vitals:   01/04/23 0614  TempSrc:   PainSc: 4    Pain Goal:                   Laban Emperor

## 2023-01-04 NOTE — Progress Notes (Addendum)
Brief Progress Note  Called to bedside to assess bleeding with fundal massage. Per bedside RN, fundus has been firm and with this last fundal rub, she noticed a few more clots expelled. Fundal massage performed while I was in the room with dime to quarter sized clots. An in and out cath was performed with ~150cc clear urine. I performed a uterine sweep and was able to remove ~190cc of similar sized clots from lower uterine segment. Kim, RN, repeated fundal massage with improved uterine tone. Given a dose of Methergine as well. On repeat fundal, uterus was firm under umbilicus and bleeding significantly improved.   Sundra Aland, MD 1:08 AM

## 2023-01-04 NOTE — Progress Notes (Addendum)
Circumcision Consent  Discussed with mom at bedside about circumcision.   Circumcision is a surgery that removes the skin that covers the tip of the penis, called the "foreskin." Circumcision is usually done when a boy is between 29 and 31 days old, sometimes up to 66-9 weeks old.  The most common reasons boys are circumcised include for cultural/religious beliefs or for parental preference (potentially easier to clean, so baby looks like daddy, etc).  There may be some medical benefits for circumcision:   Circumcised boys seem to have slightly lower rates of: ? Urinary tract infections (per the American Academy of Pediatrics an uncircumcised boy has a 1/100 chance of developing a UTI in the first year of life, a circumcised boy at a 03/998 chance of developing a UTI in the first year of life- a 10% reduction) ? Penis cancer (typically rare- an uncircumcised female has a 1 in 100,000 chance of developing cancer of the penis) ? Sexually transmitted infection (in endemic areas, including HIV, HPV and Herpes- circumcision does NOT protect against gonorrhea, chlamydia, trachomatis, or syphilis) ? Phimosis: a condition where that makes retraction of the foreskin over the glans impossible (0.4 per 1000 boys per year or 0.6% of boys are affected by their 15th birthday)  Boys and men who are not circumcised can reduce these extra risks by: ? Cleaning their penis well ? Using condoms during sex  What are the risks of circumcision?  As with any surgical procedure, there are risks and complications. In circumcision, complications are rare and usually minor, the most common being: ? Bleeding- risk is reduced by holding each clamp for 30 seconds prior to a cut being made, and by holding pressure after the procedure is done ? Infection- the penis is cleaned prior to the procedure, and the procedure is done under sterile technique ? Damage to the urethra or amputation of the penis  How is circumcision done  in baby boys?  The baby will be placed on a special table and the legs restrained for their safety. Numbing medication is injected into the penis, and the skin is cleansed with betadine to decrease the risk of infection.   What to expect:  The penis will look red and raw for 5-7 days as it heals. We expect scabbing around where the cut was made, as well as clear-pink fluid and some swelling of the penis right after the procedure. If your baby's circumcision starts to bleed or develops pus, please contact your pediatrician immediately.  All questions were answered and mother consented.  Scheryl Darter MD Patient ID: Pincus Large, female   DOB: 07/26/1995, 27 y.o.   MRN: [id number]

## 2023-01-04 NOTE — Progress Notes (Signed)
Post Partum Day 1 Subjective: no complaints, up ad lib, voiding, and tolerating PO  Objective: Blood pressure 100/67, pulse 81, temperature 97.9 F (36.6 C), temperature source Oral, resp. rate 16, height 5\' 1"  (1.549 m), weight 98.4 kg, last menstrual period 04/04/2022, SpO2 98%, unknown if currently breastfeeding.  Physical Exam:  General: alert, cooperative, and no distress Lochia: appropriate Uterine Fundus: firm Incision:  DVT Evaluation: No evidence of DVT seen on physical exam.  Recent Labs    01/02/23 1000  HGB 12.1  HCT 35.2*    Assessment/Plan: Breastfeeding and Circumcision prior to discharge   LOS: 2 days   Scheryl Darter, MD 01/04/2023, 9:39 AM

## 2023-01-05 MED ORDER — IBUPROFEN 800 MG PO TABS: 800.0000 mg | ORAL_TABLET | Freq: Three times a day (TID) | ORAL | 0 refills | Status: DC

## 2023-01-05 MED ORDER — ACETAMINOPHEN 325 MG PO TABS: 650.0000 mg | ORAL_TABLET | Freq: Four times a day (QID) | ORAL | 0 refills | Status: DC | PRN

## 2023-01-05 MED ORDER — CLOTRIMAZOLE-BETAMETHASONE 1-0.05 % EX CREA: TOPICAL_CREAM | CUTANEOUS | 0 refills | Status: DC

## 2023-01-05 NOTE — Lactation Note (Signed)
This note was copied from a baby's chart. Lactation Consultation Note  Patient Name: Valerie Greene [id number] Date: 01/05/2023 Age:29 hours Reason for consult: Follow-up assessment;Primapara;1st time breastfeeding;Term  P1- MOB stated that she plans on breastfeeding and formula feeding. MOB is concerned that she does not have any milk at this time. LC reassured MOB that this is normal, but it is important to continue frequent stimulation of the breasts. MOB verbalized understanding.   LC reviewed CDC milk storage guidelines, LC services handout and engorgement/breast care. LC encouraged MOB to see an outpatient LC when she is ready to start latching infant to the breast.  Maternal Data Does the patient have breastfeeding experience prior to this delivery?: No  Feeding Mother's Current Feeding Choice: Breast Milk and Formula Nipple Type: Regular  Lactation Tools Discussed/Used Tools: Coconut oil;Pump;Flanges Flange Size: 18;21 Breast pump type: Manual Pump Education: Milk Storage;Setup, frequency, and cleaning  Interventions Interventions: Breast feeding basics reviewed;Education;Coconut oil;LC Services brochure  Discharge Discharge Education: Engorgement and breast care;Warning signs for feeding baby;Outpatient recommendation Pump: Manual;Hands Free;Personal  Consult Status Consult Status: Complete Date: 01/05/23    Dema Severin BS, IBCLC 01/05/2023, 12:35 PM

## 2023-01-06 ENCOUNTER — Other Ambulatory Visit: Payer: Medicaid Other

## 2023-01-06 ENCOUNTER — Encounter: Payer: Medicaid Other | Admitting: Obstetrics & Gynecology

## 2023-01-09 ENCOUNTER — Other Ambulatory Visit: Payer: Medicaid Other

## 2023-01-09 ENCOUNTER — Encounter: Payer: Self-pay | Admitting: Obstetrics & Gynecology

## 2023-01-09 ENCOUNTER — Other Ambulatory Visit: Payer: Self-pay | Admitting: Obstetrics & Gynecology

## 2023-01-09 DIAGNOSIS — O2343 Unspecified infection of urinary tract in pregnancy, third trimester: Secondary | ICD-10-CM

## 2023-01-09 MED ORDER — SULFAMETHOXAZOLE-TRIMETHOPRIM 800-160 MG PO TABS
1.0000 | ORAL_TABLET | Freq: Two times a day (BID) | ORAL | 0 refills | Status: AC
Start: 2023-01-09 — End: 2023-01-14

## 2023-01-09 NOTE — Progress Notes (Signed)
Rx for UTI- Bactrim

## 2023-01-20 ENCOUNTER — Encounter: Payer: Self-pay | Admitting: Obstetrics & Gynecology

## 2023-01-21 ENCOUNTER — Encounter: Payer: Self-pay | Admitting: *Deleted

## 2023-01-27 ENCOUNTER — Telehealth (HOSPITAL_COMMUNITY): Payer: Self-pay

## 2023-01-27 NOTE — Telephone Encounter (Signed)
01/27/2023 1954  Name: Valerie Greene MRN: [id number] DOB: [birth year]  Reason for Call:  Transition of Care Hospital Discharge Call  Contact Status: Patient Contact Status: Message  Language assistant needed: Interpreter Mode: Interpreter Not Needed        Follow-Up Questions:    Inocente Salles Postnatal Depression Scale:  In the Past 7 Days:    PHQ2-9 Depression Scale:     Discharge Follow-up:    Post-discharge interventions: NA  Signature  Signe Colt

## 2023-02-12 ENCOUNTER — Ambulatory Visit: Payer: Medicaid Other | Admitting: Women's Health

## 2023-02-18 ENCOUNTER — Ambulatory Visit (INDEPENDENT_AMBULATORY_CARE_PROVIDER_SITE_OTHER): Payer: Medicaid Other | Admitting: Women's Health

## 2023-02-18 ENCOUNTER — Encounter: Payer: Self-pay | Admitting: Women's Health

## 2023-02-18 DIAGNOSIS — Z3009 Encounter for other general counseling and advice on contraception: Secondary | ICD-10-CM

## 2023-02-18 NOTE — Progress Notes (Signed)
POSTPARTUM VISIT Patient name: Valerie Greene MRN [id number]  Date of birth: May 26, 1995 Chief Complaint:   Postpartum Care  History of Present Illness:   Valerie Greene is a 27 y.o. G15P2012 Caucasian female being seen today for a postpartum visit. She is 6 weeks postpartum following a spontaneous vaginal delivery at 39.1 gestational weeks. IOL: yes, for polyhydramnios. Anesthesia: epidural.  Laceration: 1st degree.  Complications: none. Inpatient contraception: no.   Pregnancy complicated by polyhydramnios . Tobacco use: yes. Substance use disorder: no. Last pap smear: 07/28/22 and results were NILM w/ HRHPV negative. Next pap smear due: 2027 Patient's last menstrual period was 02/12/2023.  Postpartum course has been uncomplicated. Has dots on vulva that appeared in 3rd trimester and are still there.  Bleeding none. Bowel function is normal. Bladder function is normal. Urinary incontinence? no, fecal incontinence? no Patient is sexually active. Last sexual activity:  did nto discuss . Desired contraception: condoms . Patient does not know want a pregnancy in the future.  Desired family size is uncertain #of children.   Upstream - 02/18/23 1406       Pregnancy Intention Screening   Does the patient want to become pregnant in the next year? No    Does the patient's partner want to become pregnant in the next year? No    Would the patient like to discuss contraceptive options today? No      Contraception Wrap Up   Current Method No Method - Other Reason    End Method Female Condom            The pregnancy intention screening data noted above was reviewed. Potential methods of contraception were discussed. The patient elected to proceed with Female Condom.  Edinburgh Postpartum Depression Screening: negative  Edinburgh Postnatal Depression Scale - 02/18/23 1407       Edinburgh Postnatal Depression Scale:  In the Past 7 Days   I have been able to laugh and see the funny side of  things. 0    I have looked forward with enjoyment to things. 2    I have blamed myself unnecessarily when things went wrong. 0    I have been anxious or worried for no good reason. 2    I have felt scared or panicky for no good reason. 2    Things have been getting on top of me. 0    I have been so unhappy that I have had difficulty sleeping. 0    I have felt sad or miserable. 0    I have been so unhappy that I have been crying. 0    The thought of harming myself has occurred to me. 0    Edinburgh Postnatal Depression Scale Total 6                07/07/2022   10:02 AM 07/22/2019   11:26 AM  GAD 7 : Generalized Anxiety Score  Nervous, Anxious, on Edge 0 0  Control/stop worrying 0 0  Worry too much - different things 0 0  Trouble relaxing 0 0  Restless 0 0  Easily annoyed or irritable 0 0  Afraid - awful might happen 0 0  Total GAD 7 Score 0 0     Baby's course has been uncomplicated. Baby is feeding by bottle. Infant has a pediatrician/family doctor? Yes.  Childcare strategy if returning to work/school: family.  Pt has material needs met for her and baby: Yes.   Review of Systems:  Pertinent items are noted in HPI Denies Abnormal vaginal discharge w/ itching/odor/irritation, headaches, visual changes, shortness of breath, chest pain, abdominal pain, severe nausea/vomiting, or problems with urination or bowel movements. Pertinent History Reviewed:  Reviewed past medical,surgical, obstetrical and family history.  Reviewed problem list, medications and allergies. OB History  Gravida Para Term Preterm AB Living  3 2 2   1 2   SAB IAB Ectopic Multiple Live Births    1   0 2    # Outcome Date GA Lbr Len/2nd Weight Sex Type Anes PTL Lv  3 Term 01/03/23 [redacted]w[redacted]d / 00:15 8 lb 5 oz (3.77 kg) M Vag-Spont EPI  LIV  2 IAB 03/2017          1 Term 07/31/10 [redacted]w[redacted]d  7 lb 10 oz (3.459 kg) F Vag-Spont EPI  LIV   Physical Assessment:   Vitals:   02/18/23 1404  BP: 110/69  Pulse: (!) 58   Weight: 203 lb (92.1 kg)  Height: 5\' 1"  (1.549 m)  Body mass index is 38.36 kg/m.       Physical Examination:   General appearance: alert, well appearing, and in no distress  Mental status: alert, oriented to person, place, and time  Skin: warm & dry   Cardiovascular: normal heart rate noted   Respiratory: normal respiratory effort, no distress   Breasts: deferred, no complaints   Abdomen: soft, non-tender   Pelvic: punctate blood vessels bilateral labia majora. Thin prep pap obtained: No  Rectal: not examined  Extremities: Edema: none   Chaperone: Malachy Mood         No results found for this or any previous visit (from the past 24 hour(s)).  Assessment & Plan:  1) Postpartum exam 2) 6 wks s/p spontaneous vaginal delivery after IOL for poly 3) bottle feeding 4) Depression screening 5) Contraception counseling> plans condoms 6) Punctate vulvar blood vessels> discussed can be from pressure during pregnancy, usually improve after baby, may not completely resolve  Essential components of care per ACOG recommendations:  1.  Mood and well being:  If positive depression screen, discussed and plan developed.  If using tobacco we discussed reduction/cessation and risk of relapse If current substance abuse, we discussed and referral to local resources was offered.   2. Infant care and feeding:  If breastfeeding, discussed returning to work, pumping, breastfeeding-associated pain, guidance regarding return to fertility while lactating if not using another method. If needed, patient was provided with a letter to be allowed to pump q 2-3hrs to support lactation in a private location with access to a refrigerator to store breastmilk.   Recommended that all caregivers be immunized for flu, pertussis and other preventable communicable diseases If pt does not have material needs met for her/baby, referred to local resources for help obtaining these.  3. Sexuality, contraception and birth  spacing Provided guidance regarding sexuality, management of dyspareunia, and resumption of intercourse Discussed avoiding interpregnancy interval <66mths and recommended birth spacing of 18 months  4. Sleep and fatigue Discussed coping options for fatigue and sleep disruption Encouraged family/partner/community support of 4 hrs of uninterrupted sleep to help with mood and fatigue  5. Physical recovery  If pt had a C/S, assessed incisional pain and providing guidance on normal vs prolonged recovery If pt had a laceration, perineal healing and pain reviewed.  If urinary or fecal incontinence, discussed management and referred to PT or uro/gyn if indicated  Patient is safe to resume physical activity. Discussed attainment of healthy weight.  6.  Chronic disease management Discussed pregnancy complications if any, and their implications for future childbearing and long-term maternal health. Review recommendations for prevention of recurrent pregnancy complications, such as 17 hydroxyprogesterone caproate to reduce risk for recurrent PTB not applicable, or aspirin to reduce risk of preeclampsia not applicable. Pt had GDM: no. If yes, 2hr GTT scheduled: not applicable. Reviewed medications and non-pregnant dosing including consideration of whether pt is breastfeeding using a reliable resource such as LactMed: not applicable Referred for f/u w/ PCP or subspecialist providers as indicated: not applicable  7. Health maintenance Mammogram at 29yo or earlier if indicated Pap smears as indicated  Meds: No orders of the defined types were placed in this encounter.   Follow-up: Return in about 1 year (around 02/18/2024) for Physical.   No orders of the defined types were placed in this encounter.   Cheral Marker CNM, [facility] 02/18/2023 2:31 PM

## 2023-07-11 ENCOUNTER — Other Ambulatory Visit: Payer: Self-pay

## 2023-07-11 ENCOUNTER — Encounter (HOSPITAL_COMMUNITY): Payer: Self-pay

## 2023-07-11 ENCOUNTER — Emergency Department (HOSPITAL_COMMUNITY)
Admission: EM | Admit: 2023-07-11 | Discharge: 2023-07-11 | Disposition: A | Discharge status: Home / Self Care | Attending: Emergency Medicine | Admitting: Emergency Medicine

## 2023-07-11 DIAGNOSIS — N3091 Cystitis, unspecified with hematuria: Secondary | ICD-10-CM | POA: Insufficient documentation

## 2023-07-11 DIAGNOSIS — R319 Hematuria, unspecified: Secondary | ICD-10-CM

## 2023-07-11 DIAGNOSIS — N3090 Cystitis, unspecified without hematuria: Secondary | ICD-10-CM

## 2023-07-11 LAB — URINALYSIS, ROUTINE W REFLEX MICROSCOPIC
Bilirubin Urine: NEGATIVE
Glucose, UA: NEGATIVE mg/dL
Ketones, ur: NEGATIVE mg/dL
Nitrite: NEGATIVE
Protein, ur: 30 mg/dL — AB
RBC / HPF: >50 RBC/hpf (ref 0–5)
Specific Gravity, Urine: 1.018 (ref 1.005–1.030)
pH: 6 (ref 5.0–8.0)

## 2023-07-11 LAB — PREGNANCY, URINE: Preg Test, Ur: NEGATIVE

## 2023-07-11 MED ORDER — CEPHALEXIN 500 MG PO CAPS: 500.0000 mg | ORAL_CAPSULE | Freq: Two times a day (BID) | ORAL | 0 refills | Status: AC

## 2023-07-11 MED ORDER — FLUCONAZOLE 150 MG PO TABS: 150.0000 mg | ORAL_TABLET | Freq: Once | ORAL | 0 refills | Status: AC

## 2023-07-11 MED ORDER — CEPHALEXIN 500 MG PO CAPS
500.0000 mg | ORAL_CAPSULE | Freq: Once | ORAL | Status: AC
  Administered 2023-07-11: 500 mg via ORAL
  Filled 2023-07-11: qty 1

## 2023-07-11 NOTE — Discharge Instructions (Addendum)
 Please follow-up closely with your primary care doctor on an outpatient basis for a recheck.  Return to emergency department immediately for any new or worsening symptoms.

## 2023-07-11 NOTE — ED Triage Notes (Signed)
 Pt arrived from home via POV c/o blood in urine that began on Thursday. Pt denies any pain or discomfort anywhere in the body.

## 2023-07-11 NOTE — ED Provider Notes (Signed)
 [place] EMERGENCY DEPARTMENT AT [facility] Provider Note   CSN: [id number] Arrival date & time: 07/11/23  1958     History  Chief Complaint  Patient presents with   Hematuria    Valerie Greene is a 28 y.o. female.  Patient is a 28 year old female who presents emergency department the chief complaint of hematuria.  Patient notes that symptoms have been ongoing for approximate the past 2 days.  Patient denies any abdominal pain, flank pain, back pain, fever or chills.  She has had no associated nausea, vomiting, diarrhea.  She denies any dysuria.   Hematuria       Home Medications Prior to Admission medications   Not on File      Allergies    Patient has no known allergies.    Review of Systems   Review of Systems  Genitourinary:  Positive for hematuria.  All other systems reviewed and are negative.   Physical Exam Updated Vital Signs BP 112/76 (BP Location: Right Arm)   Pulse 77   Temp 98.9 F (37.2 C) (Oral)   Resp 16   Ht 5' (1.524 m)   Wt 90.7 kg   LMP 07/02/2023   SpO2 95%   Breastfeeding No   BMI 39.06 kg/m  Physical Exam Vitals and nursing note reviewed.  Constitutional:      Appearance: Normal appearance.  HENT:     Head: Normocephalic and atraumatic.  Cardiovascular:     Rate and Rhythm: Normal rate and regular rhythm.     Pulses: Normal pulses.     Heart sounds: Normal heart sounds. No murmur heard.    No gallop.  Pulmonary:     Effort: Pulmonary effort is normal. No respiratory distress.     Breath sounds: Normal breath sounds. No wheezing or rales.  Abdominal:     General: Abdomen is flat. Bowel sounds are normal. There is no distension.     Palpations: Abdomen is soft.     Tenderness: There is no abdominal tenderness. There is no guarding.  Musculoskeletal:        General: Normal range of motion.     Cervical back: Normal range of motion and neck supple.  Skin:    General: Skin is warm and dry.  Neurological:      General: No focal deficit present.     Mental Status: She is alert and oriented to person, place, and time. Mental status is at baseline.  Psychiatric:        Mood and Affect: Mood normal.        Behavior: Behavior normal.        Thought Content: Thought content normal.        Judgment: Judgment normal.     ED Results / Procedures / Treatments   Labs (all labs ordered are listed, but only abnormal results are displayed) Labs Reviewed  URINALYSIS, ROUTINE W REFLEX MICROSCOPIC - Abnormal; Notable for the following components:      Result Value   APPearance CLOUDY (*)    Hgb urine dipstick LARGE (*)    Protein, ur 30 (*)    Leukocytes,Ua SMALL (*)    Bacteria, UA RARE (*)    All other components within normal limits  PREGNANCY, URINE    EKG None  Radiology No results found.  Procedures Procedures    Medications Ordered in ED Medications  cephALEXin (KEFLEX) capsule 500 mg (has no administration in time range)    ED Course/ Medical Decision  Making/ A&P                                 Medical Decision Making Amount and/or Complexity of Data Reviewed Labs: ordered.  Risk Prescription drug management.   This patient presents to the ED for concern of hematuria differential diagnosis includes kidney stone, cystitis, pyelonephritis, malignancy    Additional history obtained:  Additional history obtained from none External records from outside source obtained and reviewed including none   Lab Tests:  I Ordered, and personally interpreted labs.  The pertinent results include: Urinalysis with leukocytes and hematuria   Medicines ordered and prescription drug management:  I ordered medication including Keflex, Diflucan for cystitis and candidal vaginitis Reevaluation of the patient after these medicines showed that the patient improved I have reviewed the patients home medicines and have made adjustments as needed   Problem List / ED Course:  Patient is  doing well at this time and is stable for discharge home.  Discussed with patient we will treat her for hemorrhagic cystitis at this time.  Low suspicion for kidney stone as she has no associated acute pain.  Vital signs are stable with no indication for sepsis.  Will send urine culture at this time as well.  Did recommend close follow-up with primary care doctor on an outpatient basis to ensure resolution of symptoms as patient is a smoker.  Strict return precautions were discussed for any new or worsening symptoms.  Patient voiced understanding and had no additional questions.   Social Determinants of Health:  None           Final Clinical Impression(s) / ED Diagnoses Final diagnoses:  None    Rx / DC Orders ED Discharge Orders     None         Emmalene Hare 07/11/23 2139    Cheyenne Cotta, MD 07/13/23 1024

## 2023-07-14 LAB — URINE CULTURE: Culture: 60000 — AB

## 2023-07-15 ENCOUNTER — Telehealth (HOSPITAL_BASED_OUTPATIENT_CLINIC_OR_DEPARTMENT_OTHER): Payer: Self-pay | Admitting: Emergency Medicine

## 2023-07-15 NOTE — Telephone Encounter (Signed)
 Post ED Visit - Positive Culture Follow-up  Culture report reviewed by antimicrobial stewardship pharmacist: [facility] []  Court Distance, Pharm.D. []  Skeet Duke, Pharm.D., BCPS AQ-ID []  Leslee Rase, Pharm.D., BCPS []  Garland Junk, [address].D., BCPS []  Nara Visa, [address].D., BCPS, AAHIVP []  Alcide Aly, Pharm.D., BCPS, AAHIVP []  Jerri Morale, PharmD, BCPS []  Graham Laws, PharmD, BCPS []  Cleda Curly, PharmD, BCPS [x]  Argentina Bees, PharmD []  Ballard Levels, PharmD, BCPS []  Ollen Beverage, PharmD  [facility] []  Arlyne Bering, PharmD []  Sherryle Don, PharmD []  Van Gelinas, PharmD []  Delila Felty, Rph []  Luna [place]) Cleora Daft, PharmD []  Augustina Block, PharmD []  Arie Kurtz, PharmD []  Sharlyn Deaner, PharmD []  Agnes Hose, PharmD []  Kendall Pauls, PharmD []  Gladstone Lamer, PharmD []  Armanda Bern, PharmD []  Tera Fellows, PharmD   Positive urine culture Treated with Cephalexin, organism sensitive to the same and no further patient follow-up is required at this time.  Valerie Greene 07/15/2023, 2:56 PM

## 2023-07-19 ENCOUNTER — Emergency Department (HOSPITAL_COMMUNITY)
Admission: EM | Admit: 2023-07-19 | Discharge: 2023-07-19 | Disposition: A | Discharge status: Home / Self Care | Attending: Emergency Medicine | Admitting: Emergency Medicine

## 2023-07-19 ENCOUNTER — Other Ambulatory Visit: Payer: Self-pay

## 2023-07-19 ENCOUNTER — Emergency Department (HOSPITAL_COMMUNITY)

## 2023-07-19 ENCOUNTER — Encounter (HOSPITAL_COMMUNITY): Payer: Self-pay | Admitting: Emergency Medicine

## 2023-07-19 DIAGNOSIS — M79672 Pain in left foot: Secondary | ICD-10-CM | POA: Insufficient documentation

## 2023-07-19 MED ORDER — MELOXICAM 15 MG PO TABS: 15.0000 mg | ORAL_TABLET | Freq: Every day | ORAL | 0 refills | Status: AC

## 2023-07-19 NOTE — Discharge Instructions (Addendum)
 Return to the emergency department if swelling worsens, foot becomes red/warm with extension up leg, or if you are unable to walk secondary to pain.

## 2023-07-19 NOTE — ED Triage Notes (Signed)
 Pt ambulatory to triage with c/o left foot pain and swelling for last 4 days.  Denies known injury, ambulatory without difficulty.
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
## 2023-07-23 ENCOUNTER — Encounter: Payer: Self-pay | Admitting: Obstetrics & Gynecology

## 2024-01-12 ENCOUNTER — Ambulatory Visit: Admitting: Women's Health

## 2024-02-16 ENCOUNTER — Emergency Department (HOSPITAL_COMMUNITY)
Admission: EM | Admit: 2024-02-16 | Discharge: 2024-02-16 | Disposition: A | Discharge status: Home / Self Care | Payer: Self-pay | Attending: Emergency Medicine | Admitting: Emergency Medicine

## 2024-02-16 ENCOUNTER — Other Ambulatory Visit: Payer: Self-pay

## 2024-02-16 ENCOUNTER — Encounter (HOSPITAL_COMMUNITY): Payer: Self-pay | Admitting: *Deleted

## 2024-02-16 ENCOUNTER — Emergency Department (HOSPITAL_COMMUNITY): Payer: Self-pay

## 2024-02-16 DIAGNOSIS — M79671 Pain in right foot: Secondary | ICD-10-CM | POA: Insufficient documentation

## 2024-02-16 MED ORDER — MELOXICAM 15 MG PO TABS: 15.0000 mg | ORAL_TABLET | Freq: Every day | ORAL | 0 refills | Status: AC

## 2024-02-16 NOTE — Discharge Instructions (Signed)
 As discussed, try wearing supportive shoes while at work.  Take medication as directed.  Please contact the podiatrist listed to arrange follow-up appointment if your symptoms are not improving in 1 week.

## 2024-02-16 NOTE — ED Triage Notes (Signed)
 Pt with right foot pain a week ago, denies any injury.

## 2024-02-18 NOTE — ED Provider Notes (Signed)
 [place] EMERGENCY DEPARTMENT AT [facility] Provider Note   CSN: [id number] Arrival date & time: 02/16/24  1647     Patient presents with: Foot Pain   Valerie Greene is a 28 y.o. female.    Foot Pain Pertinent negatives include no shortness of breath.       Valerie Greene is a 28 y.o. female who presents to the Emergency Department complaining of right foot pain for 1 week.  Denies any known injury.  States that she had similar symptoms several months ago with her left foot that spontaneously resolved.  She does admit to a lot of standing and walking at her job and typically wears crocs to work.  Denies any numbness or swelling or open wounds of her lower extremities.  No calf pain or edema.  Prior to Admission medications   Medication Sig Start Date End Date Taking? Authorizing Provider  meloxicam  (MOBIC ) 15 MG tablet Take 1 tablet (15 mg total) by mouth daily. Take with food 02/16/24  Yes Lenyx Boody, PA-C    Allergies: Patient has no known allergies.    Review of Systems  Respiratory:  Negative for shortness of breath.   Musculoskeletal:  Positive for arthralgias (Right foot pain). Negative for joint swelling.  Skin:  Negative for color change and wound.  Neurological:  Negative for weakness and numbness.    Updated Vital Signs BP 115/84 (BP Location: Right Arm)   Pulse 81   Temp 97.9 F (36.6 C) (Temporal)   Resp 16   Ht 5' (1.524 m)   Wt 95.3 kg   LMP 02/02/2024 (Approximate)   SpO2 99%   BMI 41.01 kg/m   Physical Exam Vitals and nursing note reviewed.  Constitutional:      General: She is not in acute distress.    Appearance: Normal appearance.  Cardiovascular:     Rate and Rhythm: Normal rate and regular rhythm.     Pulses: Normal pulses.  Pulmonary:     Effort: Pulmonary effort is normal.  Musculoskeletal:        General: Tenderness present. No swelling, deformity or signs of injury.     Right lower leg: No edema.     Left  lower leg: No edema.     Right foot: Normal range of motion and normal capillary refill. Tenderness present. No swelling, deformity, prominent metatarsal heads or bony tenderness. Normal pulse.     Comments: Tenderness palpation dorsal right foot along the 1st, 2nd and 3rd metatarsal heads.  No erythema edema or excessive warmth.  There is no open wound or skin changes between the toes.  Ankle nontender.  No calf pain erythema or edema.  Skin:    General: Skin is warm.     Capillary Refill: Capillary refill takes less than 2 seconds.     Coloration: Skin is not pale.     Findings: No bruising or erythema.  Neurological:     General: No focal deficit present.     Mental Status: She is alert.     Sensory: No sensory deficit.     Motor: No weakness.     (all labs ordered are listed, but only abnormal results are displayed) Labs Reviewed - No data to display  EKG: None  Radiology: DG Foot Complete Right Result Date: 02/16/2024 EXAM: 3 OR MORE VIEW(S) XRAY OF THE RIGHT FOOT 02/16/2024 05:19:21 PM COMPARISON: None available. CLINICAL HISTORY: c/o pain x 1 week FINDINGS: BONES AND JOINTS: No acute fracture.  No focal osseous lesion. No joint dislocation. SOFT TISSUES: The soft tissues are unremarkable. IMPRESSION: 1. No significant abnormality. Electronically signed by: Lynwood Seip MD 02/16/2024 05:23 PM EST RP Workstation: [id number]     Procedures   Medications Ordered in the ED - No data to display                                  Medical Decision Making   Patient here with 1 week history of pain to her right foot.  She was also seen here several months ago with similar symptoms to her left foot.  She denies any known injury skin changes or swelling.  No numbness or weakness of the extremity.  No calf pain or edema.  No history of DVT and her symptoms are localized to the foot.  She does admit to excessive walking and standing at her job and wears poor fitting shoes to work.  I  suspect this is inflammatory process as her pain is at the base of the toes and head of the metatarsals.  Will obtain x-ray.  Doubt emergent process.  Will treat symptomatically and give referral to podiatry.  I also recommended that she wear proper fitting shoes to work.  Amount and/or Complexity of Data Reviewed Radiology: ordered.    Details: X-ray of the foot without significant abnormality  Risk Prescription drug management.        Final diagnoses:  Foot pain, right    ED Discharge Orders          Ordered    meloxicam  (MOBIC ) 15 MG tablet  Daily        02/16/24 1808               Herlinda Milling, PA-C 02/18/24 1407    Suzette Pac, MD 02/18/24 1438
